# Patient Record
Sex: Female | Born: 1985 | Race: White | Hispanic: Yes | Marital: Single | Smoking: Never smoker
Health system: Southern US, Community
[De-identification: ages and names within clinical notes are randomized; demographics above are authoritative.]

## PROBLEM LIST (undated history)

## (undated) DIAGNOSIS — R87629 Unspecified abnormal cytological findings in specimens from vagina: Secondary | ICD-10-CM

## (undated) DIAGNOSIS — Z789 Other specified health status: Secondary | ICD-10-CM

## (undated) HISTORY — PX: CERVICAL CONE BIOPSY: SUR198

---

## 2016-11-04 ENCOUNTER — Ambulatory Visit: Payer: Self-pay | Attending: Family Medicine | Admitting: Family Medicine

## 2016-11-04 ENCOUNTER — Encounter: Payer: Self-pay | Admitting: Family Medicine

## 2016-11-04 VITALS — BP 109/71 | HR 73 | Temp 98.7°F | Ht 64.0 in | Wt 144.8 lb

## 2016-11-04 DIAGNOSIS — R1032 Left lower quadrant pain: Secondary | ICD-10-CM | POA: Insufficient documentation

## 2016-11-04 DIAGNOSIS — M5442 Lumbago with sciatica, left side: Secondary | ICD-10-CM | POA: Insufficient documentation

## 2016-11-04 DIAGNOSIS — N644 Mastodynia: Secondary | ICD-10-CM | POA: Insufficient documentation

## 2016-11-04 DIAGNOSIS — Z79899 Other long term (current) drug therapy: Secondary | ICD-10-CM | POA: Insufficient documentation

## 2016-11-04 LAB — POCT URINALYSIS DIPSTICK
Bilirubin, UA: NEGATIVE
Glucose, UA: NEGATIVE
KETONES UA: NEGATIVE
Leukocytes, UA: NEGATIVE
Nitrite, UA: NEGATIVE
PH UA: 5.5
PROTEIN UA: NEGATIVE
Urobilinogen, UA: 0.2

## 2016-11-04 LAB — POCT URINE PREGNANCY: Preg Test, Ur: NEGATIVE

## 2016-11-04 MED ORDER — CYCLOBENZAPRINE HCL 10 MG PO TABS: 10.0000 mg | ORAL_TABLET | Freq: Two times a day (BID) | ORAL | 1 refills | Status: AC | PRN

## 2016-11-04 MED ORDER — POLYETHYLENE GLYCOL 3350 17 GM/SCOOP PO POWD: 17.0000 g | Freq: Every day | ORAL | 1 refills | Status: DC

## 2016-11-04 MED FILL — POLYETHYLENE GLYCOL 3350: 30 days supply | Qty: 510 | Fill #0

## 2016-11-04 MED FILL — ?CYCLOBENZAPRINE 10 MG TABL: 10 | 30 days supply | Qty: 60 | Fill #0

## 2016-11-04 NOTE — Progress Notes (Signed)
Subjective:  Patient ID: Maria RompNancy Azucena Gallagher, female    DOB: [birth year]  Age: [age] y.o. MRN: [id number]  CC: Abdominal Pain (stabbing pain- one month) and Mass (left sided present for several weeks- comes and goes)   HPI Maria Gallagher is a 30 year old female who comes into the clinic to establish care. She complains of a 3 month history of intermittent left lower quadrant pain. Denies urinary symptoms, vaginal discharge or fever. Endorses a 1 week history of bilateral breast pain but denies palpation of any masses LMP was 10/13/16 She is sexually active with the same partner. Bowel movements occur daily.  Also complaining of low back pain for the last 15 days which radiates to her left hip and sometimes prevents her from walking. Pain at its worst gets to a 10/10 right now is very minimal. She denies numbness in extremities or radiation down her legs.   History reviewed. No pertinent past medical history.   No outpatient prescriptions prior to visit.   No facility-administered medications prior to visit.     ROS Review of Systems  Constitutional: Negative for activity change and appetite change.  HENT: Negative for sinus pressure and sore throat.   Respiratory: Negative for chest tightness, shortness of breath and wheezing.   Cardiovascular: Negative for chest pain and palpitations.  Gastrointestinal: Positive for abdominal pain. Negative for abdominal distention and constipation.  Genitourinary: Negative.   Musculoskeletal: Positive for back pain.  Psychiatric/Behavioral: Negative for behavioral problems and dysphoric mood.    Objective:  BP 109/71 (BP Location: Right Arm, Patient Position: Sitting, Cuff Size: Small)   Pulse 73   Temp 98.7 F (37.1 C) (Oral)   Ht 5\' 4"  (1.626 m)   Wt 144 lb 12.8 oz (65.7 kg)   SpO2 100%   BMI 24.85 kg/m   BP/Weight 11/04/2016  Systolic BP 109  Diastolic BP 71  Wt. (Lbs) 144.8  BMI 24.85      Physical Exam    Constitutional: She is oriented to person, place, and time. She appears well-developed and well-nourished.  Cardiovascular: Normal rate, normal heart sounds and intact distal pulses.   No murmur heard. Pulmonary/Chest: Effort normal and breath sounds normal. She has no wheezes. She has no rales. She exhibits no tenderness. Right breast exhibits no mass and no tenderness. Left breast exhibits no mass and no tenderness.  Abdominal: Soft. Bowel sounds are normal. She exhibits no distension and no mass. There is tenderness (mild left lower quadrant tendernes).  Musculoskeletal: Normal range of motion. She exhibits no tenderness (No tenderness in the lumbar region; negative straight leg raise bilaterally).  Neurological: She is alert and oriented to person, place, and time.     Assessment & Plan:   1. Left lower quadrant pain UA negative for UTI We'll send off urine cytology to screen for STDs Placed on MiraLAX in the event that constipation could be contributing to her symptoms Pelvic ultrasound will be ordered at next visit if symptoms persist - ibuprofen (ADVIL,MOTRIN) 200 MG tablet; Take 200 mg by mouth every 6 (six) hours as needed. - vitamin C (ASCORBIC ACID) 250 MG tablet; Take 250 mg by mouth daily. - Urine cytology ancillary only - Urinalysis Dipstick  2. Acute midline low back pain with left-sided sciatica Placed on Flexeril - ibuprofen (ADVIL,MOTRIN) 200 MG tablet; Take 200 mg by mouth every 6 (six) hours as needed. - vitamin C (ASCORBIC ACID) 250 MG tablet; Take 250 mg by mouth daily. - Urine cytology ancillary only -  Urinalysis Dipstick  3. Mastodynia, female Unknown etiology Breast exam is negative If symptoms persist she will need a breast ultrasound - ibuprofen (ADVIL,MOTRIN) 200 MG tablet; Take 200 mg by mouth every 6 (six) hours as needed. - vitamin C (ASCORBIC ACID) 250 MG tablet; Take 250 mg by mouth daily. - Urine cytology ancillary only - Urinalysis Dipstick -  POCT urine pregnancy   Meds ordered this encounter  Medications  . cyclobenzaprine (FLEXERIL) 10 MG tablet    Sig: Take 1 tablet (10 mg total) by mouth 2 (two) times daily as needed for muscle spasms.    Dispense:  60 tablet    Refill:  1  . polyethylene glycol powder (GLYCOLAX/MIRALAX) powder    Sig: Take 17 g by mouth daily.    Dispense:  3350 g    Refill:  1    Follow-up: Return in about 1 month (around 12/04/2016) for folow up of back and abdominal pain.   Jaclyn ShaggyEnobong Amao MD

## 2016-11-04 NOTE — Progress Notes (Signed)
Hx of HPV- cervix cauterized

## 2016-11-05 LAB — URINE CYTOLOGY ANCILLARY ONLY
Chlamydia: NEGATIVE
Neisseria Gonorrhea: NEGATIVE
TRICH (WINDOWPATH): NEGATIVE

## 2016-11-07 LAB — URINE CYTOLOGY ANCILLARY ONLY
BACTERIAL VAGINITIS: NEGATIVE
CANDIDA VAGINITIS: NEGATIVE

## 2016-11-18 ENCOUNTER — Telehealth: Payer: Self-pay

## 2016-11-18 NOTE — Telephone Encounter (Signed)
-----   Message from Jaclyn ShaggyEnobong Amao, MD sent at 11/06/2016 10:40 AM EST ----- Test came back negative for chlamydia, gonorrhea and Trichomonas

## 2016-11-18 NOTE — Telephone Encounter (Signed)
Writer called patient through [employer] and was unable to speak with her.

## 2016-11-25 ENCOUNTER — Ambulatory Visit: Payer: Self-pay | Attending: Internal Medicine

## 2016-11-26 ENCOUNTER — Telehealth: Payer: Self-pay

## 2016-11-26 NOTE — Telephone Encounter (Signed)
Writer called patient through [employer] per Dr. Venetia NightAmao and was able to discuss lab test results with patient.  Patient stated understanding.

## 2017-02-19 ENCOUNTER — Encounter: Payer: Self-pay | Admitting: Family Medicine

## 2017-02-19 ENCOUNTER — Ambulatory Visit: Payer: Self-pay | Attending: Family Medicine | Admitting: Family Medicine

## 2017-02-19 VITALS — BP 104/67 | HR 70 | Temp 98.2°F | Ht 65.0 in | Wt 140.4 lb

## 2017-02-19 DIAGNOSIS — Z79899 Other long term (current) drug therapy: Secondary | ICD-10-CM | POA: Insufficient documentation

## 2017-02-19 DIAGNOSIS — R519 Headache, unspecified: Secondary | ICD-10-CM

## 2017-02-19 DIAGNOSIS — R102 Pelvic and perineal pain: Secondary | ICD-10-CM | POA: Insufficient documentation

## 2017-02-19 DIAGNOSIS — R51 Headache: Secondary | ICD-10-CM | POA: Insufficient documentation

## 2017-02-19 MED ORDER — BUTALBITAL-APAP-CAFFEINE 50-325-40 MG PO TABS: 1.0000 | ORAL_TABLET | Freq: Three times a day (TID) | ORAL | 0 refills | Status: AC | PRN

## 2017-02-19 MED FILL — BUTALB-ACETAMIN-CAFF 50-325: 50-325-40 | 10 days supply | Qty: 30 | Fill #0

## 2017-02-19 NOTE — Progress Notes (Signed)
Subjective:  Patient ID: Maria Gallagher, female    DOB: 1986/07/01  Age: 31 y.o. MRN: [id number]  CC: Headache (severe since sunday); Nausea; and Blurred Vision   HPI Maria Rompancy Azucena Azad presents Complaining of a five-day history of headaches in her bilateral temples which feels like her head is about to explode. This is the first occurrence of such and she denies a history of migraines. She does have some nausea and blurred vision with the headaches but no photophobia or neck pain. Denies sinus tenderness, postnasal drip or fevers.  She  had complained of breast pain and low back pain at her last visit which have all resolved.  She continues to have right lower quadrant and left lower quadrant pain; LMP was one and a half weeks ago.  History reviewed. No pertinent past medical history.  History reviewed. No pertinent surgical history.  Not on File   Outpatient Medications Prior to Visit  Medication Sig Dispense Refill  . cyclobenzaprine (FLEXERIL) 10 MG tablet Take 1 tablet (10 mg total) by mouth 2 (two) times daily as needed for muscle spasms. 60 tablet 1  . ibuprofen (ADVIL,MOTRIN) 200 MG tablet Take 200 mg by mouth every 6 (six) hours as needed.    . vitamin C (ASCORBIC ACID) 250 MG tablet Take 250 mg by mouth daily.    . polyethylene glycol powder (GLYCOLAX/MIRALAX) powder Take 17 g by mouth daily. (Patient not taking: Reported on 02/19/2017) 3350 g 1   No facility-administered medications prior to visit.     ROS Review of Systems  Constitutional: Negative for activity change and appetite change.  HENT: Negative for sinus pressure and sore throat.   Respiratory: Negative for chest tightness, shortness of breath and wheezing.   Cardiovascular: Negative for chest pain and palpitations.  Gastrointestinal: Positive for abdominal distention (RLQ and LLQ). Negative for abdominal pain and constipation.  Genitourinary: Negative.   Musculoskeletal: Negative.   Neurological:  Positive for headaches.  Psychiatric/Behavioral: Negative for behavioral problems and dysphoric mood.    Objective:  BP 104/67 (BP Location: Right Arm, Patient Position: Sitting, Cuff Size: Small)   Pulse 70   Temp 98.2 F (36.8 C) (Oral)   Ht 5\' 5"  (1.651 m)   Wt 140 lb 6.4 oz (63.7 kg)   SpO2 100%   BMI 23.36 kg/m   BP/Weight 02/19/2017 11/04/2016  Systolic BP 104 109  Diastolic BP 67 71  Wt. (Lbs) 140.4 144.8  BMI 23.36 24.85      Physical Exam  Constitutional: She is oriented to person, place, and time. She appears well-developed and well-nourished.  Cardiovascular: Normal rate, normal heart sounds and intact distal pulses.   No murmur heard. Pulmonary/Chest: Effort normal and breath sounds normal. She has no wheezes. She has no rales. She exhibits no tenderness.  Abdominal: Soft. Bowel sounds are normal. She exhibits no distension and no mass. There is no tenderness.  Musculoskeletal: Normal range of motion.  Neurological: She is alert and oriented to person, place, and time.     Assessment & Plan:   1. Sinus headache We'll reassess at next visit for improvement - butalbital-acetaminophen-caffeine (FIORICET, ESGIC) 50-325-40 MG tablet; Take 1 tablet by mouth every 8 (eight) hours as needed for headache.  Dispense: 30 tablet; Refill: 0  2. Pelvic pain Will need to exclude ovarian cyst - US Pelvis Complete; Future - US Transvaginal Non-OB; Future   Meds ordered this encounter  Medications  . butalbital-acetaminophen-caffeine (FIORICET, ESGIC) 50-325-40 MG tablet  Sig: Take 1 tablet by mouth every 8 (eight) hours as needed for headache.    Dispense:  30 tablet    Refill:  0    Follow-up: Return in about 1 month (around 03/19/2017) for Follow-up on pelvic pain.   Jaclyn Shaggy MD

## 2017-02-26 ENCOUNTER — Ambulatory Visit (HOSPITAL_COMMUNITY)
Admission: RE | Admit: 2017-02-26 | Discharge: 2017-02-26 | Disposition: A | Discharge status: Home / Self Care | Payer: Self-pay | Source: Ambulatory Visit | Attending: Family Medicine | Admitting: Family Medicine

## 2017-02-26 DIAGNOSIS — R102 Pelvic and perineal pain: Secondary | ICD-10-CM | POA: Insufficient documentation

## 2017-03-09 ENCOUNTER — Telehealth: Payer: Self-pay | Admitting: Family Medicine

## 2017-03-09 ENCOUNTER — Telehealth: Payer: Self-pay

## 2017-03-09 NOTE — Telephone Encounter (Signed)
Through [employer] patient was called and a VM was left letting her know that writer was trying to reach her to discuss lab results.

## 2017-03-09 NOTE — Telephone Encounter (Signed)
  Patient returning call regarding results 

## 2017-03-09 NOTE — Telephone Encounter (Signed)
-----   Message from Jaclyn ShaggyEnobong Amao, MD sent at 03/01/2017  1:20 PM EST ----- She has multiple cysts in her ovaries, these cysts are usually observed and no surgical intervention is recommended.

## 2017-03-19 ENCOUNTER — Encounter: Payer: Self-pay | Admitting: Family Medicine

## 2017-03-19 ENCOUNTER — Ambulatory Visit: Payer: Self-pay | Admitting: Family Medicine

## 2017-03-19 ENCOUNTER — Ambulatory Visit: Payer: Self-pay | Attending: Family Medicine | Admitting: Family Medicine

## 2017-03-19 VITALS — BP 120/77 | HR 61 | Temp 98.6°F | Resp 16 | Wt 143.4 lb

## 2017-03-19 DIAGNOSIS — Z13228 Encounter for screening for other metabolic disorders: Secondary | ICD-10-CM | POA: Insufficient documentation

## 2017-03-19 DIAGNOSIS — R51 Headache: Secondary | ICD-10-CM | POA: Insufficient documentation

## 2017-03-19 DIAGNOSIS — Z79899 Other long term (current) drug therapy: Secondary | ICD-10-CM | POA: Insufficient documentation

## 2017-03-19 DIAGNOSIS — N979 Female infertility, unspecified: Secondary | ICD-10-CM

## 2017-03-19 DIAGNOSIS — E282 Polycystic ovarian syndrome: Secondary | ICD-10-CM | POA: Insufficient documentation

## 2017-03-19 DIAGNOSIS — R519 Headache, unspecified: Secondary | ICD-10-CM

## 2017-03-19 DIAGNOSIS — G4709 Other insomnia: Secondary | ICD-10-CM | POA: Insufficient documentation

## 2017-03-19 LAB — COMPREHENSIVE METABOLIC PANEL
ALT: 11 U/L (ref 6–29)
AST: 15 U/L (ref 10–30)
Albumin: 4.3 g/dL (ref 3.6–5.1)
Alkaline Phosphatase: 68 U/L (ref 33–115)
BUN: 11 mg/dL (ref 7–25)
CHLORIDE: 103 mmol/L (ref 98–110)
CO2: 24 mmol/L (ref 20–31)
CREATININE: 0.69 mg/dL (ref 0.50–1.10)
Calcium: 9.3 mg/dL (ref 8.6–10.2)
Glucose, Bld: 81 mg/dL (ref 65–99)
Potassium: 4.4 mmol/L (ref 3.5–5.3)
SODIUM: 139 mmol/L (ref 135–146)
Total Bilirubin: 0.5 mg/dL (ref 0.2–1.2)
Total Protein: 7.2 g/dL (ref 6.1–8.1)

## 2017-03-19 LAB — LIPID PANEL W/REFLEX DIRECT LDL
CHOL/HDL RATIO: 2 ratio (ref <5.0)
Cholesterol: 173 mg/dL (ref <200)
HDL: 88 mg/dL (ref >50)
LDL-Cholesterol: 71 mg/dL
NON-HDL CHOLESTEROL (CALC): 85 mg/dL (ref <130)
Triglycerides: 67 mg/dL (ref <150)

## 2017-03-19 LAB — TSH: TSH: 1.75 m[IU]/L

## 2017-03-19 MED ORDER — TRAZODONE HCL 50 MG PO TABS: 50.0000 mg | ORAL_TABLET | Freq: Every evening | ORAL | 2 refills | Status: AC | PRN

## 2017-03-19 MED FILL — traZODone HCL 50 MG TABS: 50 | 30 days supply | Qty: 30 | Fill #0

## 2017-03-19 NOTE — Progress Notes (Signed)
Pt is in the office today for a f/u Pt states she would like the results from her ultrasound Pt states she was prescribed Butalbital for headaches and it is not working

## 2017-03-19 NOTE — Progress Notes (Signed)
Subjective:  Patient ID: Maria Gallagher, female    DOB: 12/23/1986  Age: 31 y.o. MRN: [id number]  CC: Follow-up   HPI Maria Gallagher is a 30 year old female who presents for follow-up visit. At her last office visit she had complained of intermittent pelvic pain and pelvic and transvaginal ultrasound revealed findings suspicious for polycystic ovarian syndrome. The patient endorses difficulty conceiving but does have one 60 year old child; would like to be referred to GYN for infertility treatment.  She had also complained of headaches and received Fioricet Her last visit which does not seem to be effective. She endorses insomnia which is daily and multiple nighttime awakenings. Denies caffeine intake and does not sleep with the TV on. Denies nausea, vomiting, blurry vision.  She is fasting in anticipation of labs today.  No past medical history on file.  No past surgical history on file.  Not on File   Outpatient Medications Prior to Visit  Medication Sig Dispense Refill  . butalbital-acetaminophen-caffeine (FIORICET, ESGIC) 50-325-40 MG tablet Take 1 tablet by mouth every 8 (eight) hours as needed for headache. 30 tablet 0  . cyclobenzaprine (FLEXERIL) 10 MG tablet Take 1 tablet (10 mg total) by mouth 2 (two) times daily as needed for muscle spasms. (Patient not taking: Reported on 03/19/2017) 60 tablet 1  . ibuprofen (ADVIL,MOTRIN) 200 MG tablet Take 200 mg by mouth every 6 (six) hours as needed.    . polyethylene glycol powder (GLYCOLAX/MIRALAX) powder Take 17 g by mouth daily. (Patient not taking: Reported on 02/19/2017) 3350 g 1  . vitamin C (ASCORBIC ACID) 250 MG tablet Take 250 mg by mouth daily.     No facility-administered medications prior to visit.     ROS Review of Systems  Constitutional: Negative for activity change, appetite change and fatigue.  HENT: Negative for congestion, sinus pressure and sore throat.   Eyes: Negative for visual disturbance.    Respiratory: Negative for cough, chest tightness, shortness of breath and wheezing.   Cardiovascular: Negative for chest pain and palpitations.  Gastrointestinal: Negative for abdominal distention, abdominal pain and constipation.  Endocrine: Negative for polydipsia.  Genitourinary: Positive for pelvic pain (ntermittent). Negative for dysuria and frequency.  Musculoskeletal: Negative for arthralgias and back pain.  Skin: Negative for rash.  Neurological: Positive for headaches. Negative for tremors, light-headedness and numbness.  Hematological: Does not bruise/bleed easily.  Psychiatric/Behavioral: Negative for agitation and behavioral problems.    Objective:  BP 120/77   Pulse 61   Temp 98.6 F (37 C) (Oral)   Resp 16   Wt 143 lb 6.4 oz (65 kg)   SpO2 98%   BMI 23.86 kg/m   BP/Weight 03/19/2017 02/19/2017 11/04/2016  Systolic BP 120 104 109  Diastolic BP 77 67 71  Wt. (Lbs) 143.4 140.4 144.8  BMI 23.86 23.36 24.85      Physical Exam  Constitutional: She is oriented to person, place, and time. She appears well-developed and well-nourished.  HENT:  Right Ear: External ear normal.  Left Ear: External ear normal.  Mouth/Throat: Oropharynx is clear and moist.  Cardiovascular: Normal rate, normal heart sounds and intact distal pulses.   No murmur heard. Pulmonary/Chest: Effort normal and breath sounds normal. She has no wheezes. She has no rales. She exhibits no tenderness.  Abdominal: Soft. Bowel sounds are normal. She exhibits no distension and no mass. There is no tenderness.  Musculoskeletal: Normal range of motion.  Neurological: She is alert and oriented to person, place, and time.  Skin: Skin is warm and dry.  Psychiatric: She has a normal mood and affect.    CLINICAL DATA:  31 year old female with recurrent bilateral pelvic pain. Initial encounter. LMP 02/12/2017.  EXAM: TRANSABDOMINAL AND TRANSVAGINAL ULTRASOUND OF PELVIS  TECHNIQUE: Both transabdominal  and transvaginal ultrasound examinations of the pelvis were performed. Transabdominal technique was performed for global imaging of the pelvis including uterus, ovaries, adnexal regions, and pelvic cul-de-sac. It was necessary to proceed with endovaginal exam following the transabdominal exam to visualize the ovaries.  COMPARISON:  None  FINDINGS: Uterus  Measurements: 7.9 x 3.8 x 4.5 cm. No fibroids or other mass visualized.  Endometrium  Thickness: 11 mm.  No focal abnormality visualized.  Right ovary  Measurements: 4.0 x 2.4 x 2.7 cm. Multiple peripherally located small follicles. No ovarian mass.  Left ovary  Measurements: 4.1 x 2.3 x 2.6 cm. Multiple peripherally located small follicles. Small 14 mm probable corpus luteum (images 59-61). No ovarian mass.  Other findings  Small volume of pelvic free fluid.  IMPRESSION: Overall physiologic appearance of the pelvis, although the appearance of somewhat numerous peripherally located follicles in both ovaries raises the possibility of polycystic ovarian syndrome.   Electronically Signed   By: Odessa FlemingH  Hall M.D.   On: 02/26/2017 15:05   Assessment & Plan:   1. Other insomnia Discussed sleep hygiene - traZODone (DESYREL) 50 MG tablet; Take 1 tablet (50 mg total) by mouth at bedtime as needed for sleep.  Dispense: 30 tablet; Refill: 2  2. 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 PCOS (polycystic ovarian syndrome) - Ambulatory referral to Gynecology  5. Screening for metabolic disorder - Comprehensive metabolic panel - TSH - Lipid Panel w/reflex Direct LDL   Meds ordered this encounter  Medications  . traZODone (DESYREL) 50 MG tablet    Sig: Take 1 tablet (50 mg total) by mouth at bedtime as needed for sleep.    Dispense:  30 tablet     Refill:  2    Follow-up: Return in about 3 months (around 06/19/2017) for follow up on headaches and insomnia.   Jaclyn ShaggyEnobong Amao MD

## 2017-03-19 NOTE — Patient Instructions (Signed)
Polycystic Ovarian Syndrome Polycystic ovarian syndrome (PCOS) is a common hormonal disorder among women of reproductive age. In most women with PCOS, many small fluid-filled sacs (cysts) grow on the ovaries, and the cysts are not part of a normal menstrual cycle. PCOS can cause problems with your menstrual periods and make it difficult to get pregnant. It can also cause an increased risk of miscarriage with pregnancy. If it is not treated, PCOS can lead to serious health problems, such as diabetes and heart disease. What are the causes? The cause of PCOS is not known, but it may be the result of a combination of certain factors, such as:  Irregular menstrual cycle.  High levels of certain hormones (androgens).  Problems with the hormone that helps to control blood sugar (insulin resistance).  Certain genes. What increases the risk? This condition is more likely to develop in women who have a family history of PCOS. What are the signs or symptoms? Symptoms of PCOS may include:  Multiple ovarian cysts.  Infrequent periods or no periods.  Periods that are too frequent or too heavy.  Unpredictable periods.  Inability to get pregnant (infertility) because of not ovulating.  Increased growth of hair on the face, chest, stomach, back, thumbs, thighs, or toes.  Acne or oily skin. Acne may develop during adulthood, and it may not respond to treatment.  Pelvic pain.  Weight gain or obesity.  Patches of thickened and dark brown or black skin on the neck, arms, breasts, or thighs (acanthosis nigricans).  Excess hair growth on the face, chest, abdomen, or upper thighs (hirsutism). How is this diagnosed? This condition is diagnosed based on:  Your medical history.  A physical exam, including a pelvic exam. Your health care provider may look for areas of increased hair growth on your skin.  Tests, such as:  Ultrasound. This may be used to examine the ovaries and the lining of the  uterus (endometrium) for cysts.  Blood tests. These may be used to check levels of sugar (glucose), female hormone (testosterone), and female hormones (estrogen and progesterone) in your blood. How is this treated? There is no cure for PCOS, but treatment can help to manage symptoms and prevent more health problems from developing. Treatment varies depending on:  Your symptoms.  Whether you want to have a baby or whether you need birth control (contraception). Treatment may include nutrition and lifestyle changes along with:  Progesterone hormone to start a menstrual period.  Birth control pills to help you have regular menstrual periods.  Medicines to make you ovulate, if you want to get pregnant.  Medicine to reduce excessive hair growth.  Surgery, in severe cases. This may involve making small holes in one or both of your ovaries. This decreases the amount of testosterone that your body produces. Follow these instructions at home:  Take over-the-counter and prescription medicines only as told by your health care provider.  Follow a healthy meal plan. This can help you reduce the effects of PCOS.  Eat a healthy diet that includes lean proteins, complex carbohydrates, fresh fruits and vegetables, low-fat dairy products, and healthy fats. Make sure to eat enough fiber.  If you are overweight, lose weight as told by your health care provider.  Losing 10% of your body weight may improve symptoms.  Your health care provider can determine how much weight loss is best for you and can help you lose weight safely.  Keep all follow-up visits as told by your health care provider. This  is important. Contact a health care provider if:  Your symptoms do not get better with medicine.  You develop new symptoms. This information is not intended to replace advice given to you by your health care provider. Make sure you discuss any questions you have with your health care provider. Document  Released: 04/09/2005 Document Revised: 08/11/2016 Document Reviewed: 05/31/2016 Elsevier Interactive Patient Education  2017 Elsevier Inc.  

## 2017-03-30 ENCOUNTER — Telehealth: Payer: Self-pay

## 2017-03-30 NOTE — Telephone Encounter (Signed)
Writer called patient through [employer] and was able to discuss lab results as well as her [place] result.  Patient stated understanding. Patient also had a big concern because she has a $400. Lab bill.  Writer spoke with Diane B. Artist.  She was going to have Daisey contact the patient and set up an appt to film out a [employer] lab financial help application.

## 2017-03-30 NOTE — Telephone Encounter (Signed)
-----   Message from Jaclyn Shaggy, MD sent at 03/01/2017  1:20 PM EST ----- She has multiple cysts in her ovaries, these cysts are usually observed and no surgical intervention is recommended.

## 2017-05-05 ENCOUNTER — Encounter: Payer: Self-pay | Admitting: Obstetrics & Gynecology

## 2017-05-05 ENCOUNTER — Ambulatory Visit (INDEPENDENT_AMBULATORY_CARE_PROVIDER_SITE_OTHER): Payer: Self-pay | Admitting: Obstetrics & Gynecology

## 2017-05-05 VITALS — BP 122/80 | HR 75 | Ht 65.0 in | Wt 141.0 lb

## 2017-05-05 DIAGNOSIS — N979 Female infertility, unspecified: Secondary | ICD-10-CM

## 2017-05-05 NOTE — Progress Notes (Signed)
   Subjective:    Patient ID: Maria Gallagher, female    DOB: Nov 20, 1986, 31 y.o.   MRN: [id number]  HPI 31 yo S H P 53(33 yo daughter) here with the issue of PCOS. She has been trying for 4 years to conceive without success. She had an u/s that was suspicous for PCOS. She conceived her daughter naturally, took about 2 years. This is a different man. He has no kids. Periods are monthly lasting 3 days.    Review of Systems Pap [place] 18 [place] ago and it was reportedly normal. 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 April MansonYalcinkaya.

## 2017-12-28 NOTE — L&D Delivery Note (Addendum)
Delivery Note I was on unit when patient called out and said baby was coming.  Faculty practice doctors tied up so we proceeded with delivery.  Pt pushed well and at 10:10 AM a healthy female was delivered via Vaginal, Spontaneous (Presentation: OA  ).  APGAR: 9, 9; weight pending .   Placenta status: delivered spontaneously.  Cord:  with the following complications: corporal x 1 delivered through.  Anesthesia:  none Episiotomy: none  Lacerations:  none Suture Repair: n/a Est. Blood Loss (mL):  125ml  GBS positive but received PCN x 2 doses.  Extra digit hanging from small stalk on left hand.  Pt had one as well.   Mom to postpartum.  Baby to Couplet care / Skin to Skin.  Oliver PilaKathy W Arien Benincasa 08/20/2018, 10:27 AM

## 2018-02-16 ENCOUNTER — Other Ambulatory Visit (HOSPITAL_COMMUNITY): Payer: Self-pay | Admitting: Nurse Practitioner

## 2018-02-16 DIAGNOSIS — Z3A13 13 weeks gestation of pregnancy: Secondary | ICD-10-CM

## 2018-02-16 DIAGNOSIS — Z3682 Encounter for antenatal screening for nuchal translucency: Secondary | ICD-10-CM

## 2018-02-21 LAB — OB RESULTS CONSOLE RPR: RPR: NONREACTIVE

## 2018-02-21 LAB — OB RESULTS CONSOLE HEPATITIS B SURFACE ANTIGEN: Hepatitis B Surface Ag: NEGATIVE

## 2018-02-21 LAB — OB RESULTS CONSOLE RUBELLA ANTIBODY, IGM: Rubella: IMMUNE

## 2018-02-21 LAB — OB RESULTS CONSOLE HIV ANTIBODY (ROUTINE TESTING): HIV: NONREACTIVE

## 2018-02-21 LAB — OB RESULTS CONSOLE GC/CHLAMYDIA
CHLAMYDIA, DNA PROBE: NEGATIVE
GC PROBE AMP, GENITAL: NEGATIVE

## 2018-02-21 LAB — OB RESULTS CONSOLE ABO/RH: RH Type: POSITIVE

## 2018-02-22 ENCOUNTER — Ambulatory Visit (HOSPITAL_COMMUNITY)
Admission: RE | Admit: 2018-02-22 | Discharge: 2018-02-22 | Disposition: A | Discharge status: Home / Self Care | Payer: Medicaid Other | Source: Ambulatory Visit | Attending: Nurse Practitioner | Admitting: Nurse Practitioner

## 2018-02-22 ENCOUNTER — Encounter (HOSPITAL_COMMUNITY): Payer: Self-pay

## 2018-02-22 DIAGNOSIS — Z3682 Encounter for antenatal screening for nuchal translucency: Secondary | ICD-10-CM | POA: Diagnosis present

## 2018-02-22 DIAGNOSIS — Z3A13 13 weeks gestation of pregnancy: Secondary | ICD-10-CM | POA: Diagnosis not present

## 2018-02-22 HISTORY — DX: Other specified health status: Z78.9

## 2018-07-25 LAB — OB RESULTS CONSOLE GBS: GBS: POSITIVE

## 2018-08-19 ENCOUNTER — Encounter (HOSPITAL_COMMUNITY): Payer: Self-pay | Admitting: *Deleted

## 2018-08-19 ENCOUNTER — Inpatient Hospital Stay (HOSPITAL_COMMUNITY)
Admission: AD | Admit: 2018-08-19 | Discharge: 2018-08-22 | DRG: 807 | Disposition: A | Discharge status: Home / Self Care | Payer: Medicaid Other | Attending: Obstetrics and Gynecology | Admitting: Obstetrics and Gynecology

## 2018-08-19 DIAGNOSIS — O99824 Streptococcus B carrier state complicating childbirth: Principal | ICD-10-CM | POA: Diagnosis present

## 2018-08-19 DIAGNOSIS — Z3A39 39 weeks gestation of pregnancy: Secondary | ICD-10-CM

## 2018-08-19 DIAGNOSIS — Z789 Other specified health status: Secondary | ICD-10-CM | POA: Diagnosis present

## 2018-08-19 DIAGNOSIS — O358XX0 Maternal care for other (suspected) fetal abnormality and damage, not applicable or unspecified: Secondary | ICD-10-CM | POA: Diagnosis present

## 2018-08-19 HISTORY — DX: Unspecified abnormal cytological findings in specimens from vagina: R87.629

## 2018-08-19 NOTE — MAU Note (Signed)
Ctxs since this am. Denies LOF or bleeding. Some mucousy d/c. 2cm last sve

## 2018-08-20 ENCOUNTER — Encounter (HOSPITAL_COMMUNITY): Payer: Self-pay

## 2018-08-20 DIAGNOSIS — O99824 Streptococcus B carrier state complicating childbirth: Secondary | ICD-10-CM | POA: Diagnosis present

## 2018-08-20 DIAGNOSIS — O358XX0 Maternal care for other (suspected) fetal abnormality and damage, not applicable or unspecified: Secondary | ICD-10-CM | POA: Diagnosis present

## 2018-08-20 DIAGNOSIS — Z789 Other specified health status: Secondary | ICD-10-CM | POA: Diagnosis present

## 2018-08-20 DIAGNOSIS — Z3483 Encounter for supervision of other normal pregnancy, third trimester: Secondary | ICD-10-CM | POA: Diagnosis present

## 2018-08-20 DIAGNOSIS — Z3A39 39 weeks gestation of pregnancy: Secondary | ICD-10-CM | POA: Diagnosis not present

## 2018-08-20 LAB — CBC
HEMATOCRIT: 33.5 % — AB (ref 36.0–46.0)
HEMOGLOBIN: 10.5 g/dL — AB (ref 12.0–15.0)
MCH: 26.3 pg (ref 26.0–34.0)
MCHC: 31.3 g/dL (ref 30.0–36.0)
MCV: 83.8 fL (ref 78.0–100.0)
Platelets: 286 10*3/uL (ref 150–400)
RBC: 4 MIL/uL (ref 3.87–5.11)
RDW: 15.4 % (ref 11.5–15.5)
WBC: 17.8 10*3/uL — AB (ref 4.0–10.5)

## 2018-08-20 LAB — RPR: RPR: NONREACTIVE

## 2018-08-20 LAB — TYPE AND SCREEN
ABO/RH(D): B POS
Antibody Screen: NEGATIVE

## 2018-08-20 LAB — ABO/RH: ABO/RH(D): B POS

## 2018-08-20 MED ORDER — FENTANYL CITRATE (PF) 100 MCG/2ML IJ SOLN
INTRAMUSCULAR | Status: AC
  Filled 2018-08-20: qty 2

## 2018-08-20 MED ORDER — DIBUCAINE 1 % RE OINT: 1.0000 "application " | TOPICAL_OINTMENT | RECTAL | Status: DC | PRN

## 2018-08-20 MED ORDER — ONDANSETRON HCL 4 MG PO TABS: 4.0000 mg | ORAL_TABLET | ORAL | Status: DC | PRN

## 2018-08-20 MED ORDER — LACTATED RINGERS IV SOLN: 500.0000 mL | Freq: Once | INTRAVENOUS | Status: DC

## 2018-08-20 MED ORDER — PHENYLEPHRINE 40 MCG/ML (10ML) SYRINGE FOR IV PUSH (FOR BLOOD PRESSURE SUPPORT)
80.0000 ug | PREFILLED_SYRINGE | INTRAVENOUS | Status: DC | PRN
  Filled 2018-08-20: qty 5

## 2018-08-20 MED ORDER — EPHEDRINE 5 MG/ML INJ
10.0000 mg | INTRAVENOUS | Status: DC | PRN
  Filled 2018-08-20: qty 2

## 2018-08-20 MED ORDER — ACETAMINOPHEN 325 MG PO TABS: 650.0000 mg | ORAL_TABLET | ORAL | Status: DC | PRN

## 2018-08-20 MED ORDER — SENNOSIDES-DOCUSATE SODIUM 8.6-50 MG PO TABS
2.0000 | ORAL_TABLET | ORAL | Status: DC
  Administered 2018-08-21 – 2018-08-22 (×2): 2 via ORAL
  Filled 2018-08-20 (×2): qty 2

## 2018-08-20 MED ORDER — OXYCODONE-ACETAMINOPHEN 5-325 MG PO TABS: 2.0000 | ORAL_TABLET | ORAL | Status: DC | PRN

## 2018-08-20 MED ORDER — SODIUM CHLORIDE 0.9 % IV SOLN
5.0000 10*6.[IU] | Freq: Once | INTRAVENOUS | Status: AC
  Administered 2018-08-20: 5 10*6.[IU] via INTRAVENOUS
  Filled 2018-08-20: qty 5

## 2018-08-20 MED ORDER — TETANUS-DIPHTH-ACELL PERTUSSIS 5-2.5-18.5 LF-MCG/0.5 IM SUSP: 0.5000 mL | Freq: Once | INTRAMUSCULAR | Status: DC

## 2018-08-20 MED ORDER — LIDOCAINE HCL (PF) 1 % IJ SOLN
30.0000 mL | INTRAMUSCULAR | Status: DC | PRN
  Filled 2018-08-20: qty 30

## 2018-08-20 MED ORDER — OXYCODONE-ACETAMINOPHEN 5-325 MG PO TABS
1.0000 | ORAL_TABLET | ORAL | Status: DC | PRN
Start: 2018-08-20 — End: 2018-08-20

## 2018-08-20 MED ORDER — WITCH HAZEL-GLYCERIN EX PADS: 1.0000 "application " | MEDICATED_PAD | CUTANEOUS | Status: DC | PRN

## 2018-08-20 MED ORDER — ZOLPIDEM TARTRATE 5 MG PO TABS: 5.0000 mg | ORAL_TABLET | Freq: Every evening | ORAL | Status: DC | PRN

## 2018-08-20 MED ORDER — COCONUT OIL OIL
1.0000 "application " | TOPICAL_OIL | Status: DC | PRN
  Administered 2018-08-22: 1 via TOPICAL
  Filled 2018-08-20: qty 120

## 2018-08-20 MED ORDER — LACTATED RINGERS IV SOLN
INTRAVENOUS | Status: DC
  Administered 2018-08-20 (×3): via INTRAVENOUS

## 2018-08-20 MED ORDER — ONDANSETRON HCL 4 MG/2ML IJ SOLN: 4.0000 mg | Freq: Four times a day (QID) | INTRAMUSCULAR | Status: DC | PRN

## 2018-08-20 MED ORDER — LACTATED RINGERS IV SOLN: 500.0000 mL | INTRAVENOUS | Status: DC | PRN

## 2018-08-20 MED ORDER — BENZOCAINE-MENTHOL 20-0.5 % EX AERO
1.0000 "application " | INHALATION_SPRAY | CUTANEOUS | Status: DC | PRN
  Administered 2018-08-20: 1 via TOPICAL
  Filled 2018-08-20: qty 56

## 2018-08-20 MED ORDER — IBUPROFEN 600 MG PO TABS
600.0000 mg | ORAL_TABLET | Freq: Four times a day (QID) | ORAL | Status: DC
  Administered 2018-08-20 – 2018-08-22 (×9): 600 mg via ORAL
  Filled 2018-08-20 (×9): qty 1

## 2018-08-20 MED ORDER — OXYTOCIN BOLUS FROM INFUSION
500.0000 mL | Freq: Once | INTRAVENOUS | Status: AC
  Administered 2018-08-20: 500 mL via INTRAVENOUS

## 2018-08-20 MED ORDER — PENICILLIN G 3 MILLION UNITS IVPB - SIMPLE MED
3.0000 10*6.[IU] | INTRAVENOUS | Status: DC
  Administered 2018-08-20: 3 10*6.[IU] via INTRAVENOUS
  Filled 2018-08-20 (×2): qty 100
  Filled 2018-08-20: qty 3

## 2018-08-20 MED ORDER — DIPHENHYDRAMINE HCL 50 MG/ML IJ SOLN: 12.5000 mg | INTRAMUSCULAR | Status: DC | PRN

## 2018-08-20 MED ORDER — FENTANYL CITRATE (PF) 100 MCG/2ML IJ SOLN
100.0000 ug | INTRAMUSCULAR | Status: DC | PRN
  Administered 2018-08-20: 100 ug via INTRAVENOUS

## 2018-08-20 MED ORDER — SOD CITRATE-CITRIC ACID 500-334 MG/5ML PO SOLN: 30.0000 mL | ORAL | Status: DC | PRN

## 2018-08-20 MED ORDER — FENTANYL 2.5 MCG/ML BUPIVACAINE 1/10 % EPIDURAL INFUSION (WH - ANES): 14.0000 mL/h | INTRAMUSCULAR | Status: DC | PRN

## 2018-08-20 MED ORDER — OXYTOCIN 40 UNITS IN LACTATED RINGERS INFUSION - SIMPLE MED
2.5000 [IU]/h | INTRAVENOUS | Status: DC
  Filled 2018-08-20: qty 1000

## 2018-08-20 MED ORDER — ONDANSETRON HCL 4 MG/2ML IJ SOLN: 4.0000 mg | INTRAMUSCULAR | Status: DC | PRN

## 2018-08-20 MED ORDER — FLEET ENEMA 7-19 GM/118ML RE ENEM: 1.0000 | ENEMA | RECTAL | Status: DC | PRN

## 2018-08-20 MED ORDER — SIMETHICONE 80 MG PO CHEW: 80.0000 mg | CHEWABLE_TABLET | ORAL | Status: DC | PRN

## 2018-08-20 MED ORDER — PRENATAL MULTIVITAMIN CH
1.0000 | ORAL_TABLET | Freq: Every day | ORAL | Status: DC
  Administered 2018-08-20 – 2018-08-22 (×3): 1 via ORAL
  Filled 2018-08-20 (×3): qty 1

## 2018-08-20 MED ORDER — DIPHENHYDRAMINE HCL 25 MG PO CAPS: 25.0000 mg | ORAL_CAPSULE | Freq: Four times a day (QID) | ORAL | Status: DC | PRN

## 2018-08-20 NOTE — Lactation Note (Signed)
This note was copied from a baby's chart. Lactation Consultation Note  Patient Name: Maria Gallagher [id number]'s Date: 08/20/2018 Reason for consult: Initial assessment;Term;Infant < 6lbs  8 hours old FT < 6 lbs female who is still being exclusively BF by her mother, she's a P2. However mom doesn't have much experience BF, she was able to BF her first child for only 2 weeks and that was 10 years ago. [facility] and already knows how to hand express. When [facility] with hand expression, no colostrum was noted though, tried both breast. Mom doesn't have a pump at home, [facility] and when [facility] pumping due to having a baby < 6 pounds mom voiced she feels more comfortable with pumping with a hand pump and that she had also requested formula but hasn't been brought to her yet. Pump instructions, cleaning and storage was reviewed as well as milk storage guidelines.  Reviewed pros and cons of formula supplementation for a FT < 6 pounds. RN brought Similac 22 and LC went over supplementation guidelines with parents. Parents needed further assistance to communicate with staff about issues like having baby's bracelet loosen up and filling baby's birth certificate ([place] in [place]; parents are Spanish speakers). LC assisted with filling up forms and also made extra copies.   Offered assistance with latch, parents agreed to wake baby up to feed since she hasn't eaten since noon. Baby was able to latch with a cradle hold and able to sustain the latch throughout the entire 30 minutes feeding. Audible swallows were noted. Asked mom to pump after feeding or after every other feeding; about 6 times/24 hours.  Encouraged mom to feed baby every 3 hours first at the breast and then with a bottle (with a  Nipple as per mom's choice). Discussed pace feeding technique and cluster feeding. Mom will feed baby any amount of EBM if she were to get any  but she also understands that pumping at this point is mainly for breast stimulation. Mom will supplement with Similac 22 calorie formula after feedings according to formula guidelines. BF brochure (SP), BF resources and feeding diary (SP) were reviewed, both parents are aware of LC services and will call PRN.  Maternal Data Formula Feeding for Exclusion: Yes Reason for exclusion: Mother's choice to formula and breast feed on admission Has patient been taught Hand Expression?: Yes Does the patient have breastfeeding experience prior to this delivery?: Yes  Feeding Feeding Type: Breast Fed Length of feed: 30 min  LATCH Score Latch: Grasps breast easily, tongue down, lips flanged, rhythmical sucking.  Audible Swallowing: A few with stimulation  Type of Nipple: Everted at rest and after stimulation  Comfort (Breast/Nipple): Soft / non-tender  Hold (Positioning): Assistance needed to correctly position infant at breast and maintain latch.  LATCH Score: 8  Interventions Interventions: Breast feeding basics reviewed;Assisted with latch;Skin to skin;Breast massage;Hand express;Breast compression;Adjust position;Hand pump;Support pillows  Lactation Tools Discussed/Used Tools: Pump Breast pump type: Manual WIC Program: Yes Pump Review: Setup, frequency, and cleaning;Milk Storage Initiated by:: MPeck Date initiated:: 08/20/18   Consult Status Consult Status: Follow-up Date: 08/21/18 Follow-up type: In-patient    Jona Erkkila Venetia ConstableS Prerna Harold 08/20/2018, 6:53 PM

## 2018-08-20 NOTE — Progress Notes (Signed)
Patient ID: Lindon RompNancy Azucena Amara, female   DOB: [birth year], 32 y.o.   MRN: [id number]  Feels like ctx have spaced out/become a little less uncomfortable  BP 108/67, P 73 FHR 120s, +accels, no decels Ctx q 5-6 mins Cx 3+/70/vtx -2, intact  IUP@term  Prodromal labor vs early labor  Will check cx in 3 hrs; if no change, will plan to d/c home to await labor  Cam HaiSHAW, Doriann Zuch CNM 08/20/2018 4:30 AM

## 2018-08-20 NOTE — Progress Notes (Signed)
Patient ID: Maria Gallagher, female   DOB: [birth year], 32 y.o.   MRN: [id number]  Pt states ctx are still strong but not closer  VSS, afeb FHR 130s, +accels, no decels, +SS Ctx q 5-6 mins Cx 4/70/vtx -2, SROM with exam for clear fluid  IUP@term  Latent labor SROM GBS pos  Expectant management Check cx in 2-3 hrs to eval for augmentation need Anticipate SVD  Cam HaiSHAW, Kaylan Yates CNM 08/20/2018 8:02 AM

## 2018-08-20 NOTE — H&P (Addendum)
Maria Gallagher is a 32 y.o. female presenting for spontaneous labor progression. Denies leaking or bldg; no H/A, N/V or visual disturbances.  OB History    Gravida  2   Para  1   Term  1   Preterm  0   AB  0   Living  1     SAB  0   TAB  0   Ectopic  0   Multiple  0   Live Births  0          Past Medical History:  Diagnosis Date  . Medical history non-contributory   . Vaginal Pap smear, abnormal    Past Surgical History:  Procedure Laterality Date  . CERVICAL CONE BIOPSY     Family History: family history is not on file. Social History:  reports that she has never smoked. She has never used smokeless tobacco. She reports that she does not drink alcohol or use drugs.     Maternal Diabetes: No Genetic Screening: Normal Maternal Ultrasounds/Referrals: Abnormal:  Findings:   Fetal Kidney Anomalies Fetal Ultrasounds or other Referrals:  Other: will need post partum f/u Maternal Substance Abuse:  No Significant Maternal Medications:  None Significant Maternal Lab Results:  Lab values include: Group B Strep positive, not varicella immune Other Comments:  None abnormal pap (ascus)  ROS History Dilation: 4 Effacement (%): 70 Station: -2 Exam by:: K.Wilson,RN Blood pressure 119/77, pulse 93, temperature 98.3 F (36.8 C), resp. rate 18, height 5\' 3"  (1.6 m), weight 82.6 kg, last menstrual period 11/20/2017.  Confirmed vertex by MAU cervical exam Exam Physical Exam  Prenatal labs: ABO, Rh:   Bpos Antibody:   neg Rubella:   immune RPR:   immune HBsAg:   neg HIV:   NR GBS:   pos  Assessment/Plan: expect SVD GBS pos, will treat with PCN Non-varicella immune, will treat post partum Abnormal fetal renal US, f/u postpartum at mother's request  No pain medication per maternal request, options discussed and available should she choose Once to floor, can discuss pitocin but patient has history of short labor with prior child and seems to be progressing  naturally here   Marthenia RollingScott Bland 08/20/2018, 12:05 AM  CNM attestation:  I have seen and examined this patient; I agree with above documentation in the resident's note.   Maria Gallagher is a 32 y.o. G2P1001 here for latent labor  PE: BP 108/67   Pulse 73   Temp 98.2 F (36.8 C) (Oral)   Resp 16   Ht 5\' 3"  (1.6 m)   Wt 82.6 kg   LMP 11/20/2017   BMI 32.24 kg/m  Gen: calm comfortable, NAD Resp: normal effort, no distress Abd: gravid  ROS, labs, PMH reviewed  [facility] Expectant management PCN for GBS ppx  Cam HaiSHAW, KIMBERLY CNM 08/20/2018, 4:31 AM

## 2018-08-21 LAB — CBC
HCT: 32.5 % — ABNORMAL LOW (ref 36.0–46.0)
HEMOGLOBIN: 10.2 g/dL — AB (ref 12.0–15.0)
MCH: 26.4 pg (ref 26.0–34.0)
MCHC: 31.4 g/dL (ref 30.0–36.0)
MCV: 84.2 fL (ref 78.0–100.0)
Platelets: 291 10*3/uL (ref 150–400)
RBC: 3.86 MIL/uL — ABNORMAL LOW (ref 3.87–5.11)
RDW: 15.7 % — AB (ref 11.5–15.5)
WBC: 16.1 10*3/uL — ABNORMAL HIGH (ref 4.0–10.5)

## 2018-08-21 NOTE — Progress Notes (Signed)
POSTPARTUM PROGRESS NOTE  Post Partum Day 1 Subjective:  Maria Gallagher is a 32 y.o. [id number] 7156w0d s/p SVD.  No acute events overnight.  Pt denies problems with ambulating, voiding or po intake.  She denies nausea or vomiting.  Pain is well controlled.  She has had flatus. She has had bowel movement.  Lochia Small.   Objective: Blood pressure 110/77, pulse 78, temperature 97.8 F (36.6 C), temperature source Oral, resp. rate 18, height 5\' 3"  (1.6 m), weight 82.6 kg, last menstrual period 11/20/2017, SpO2 100 %, unknown if currently breastfeeding.  Physical Exam:  General: alert, cooperative and no distress Lochia:normal flow Chest: no respiratory distress Heart:regular rate, distal pulses intact Abdomen: soft, nontender,  Uterine Fundus: firm, appropriately tender DVT Evaluation: No calf swelling or tenderness Extremities: minimal edema  Recent Labs    08/20/18 0049 08/21/18 0602  HGB 10.5* 10.2*  HCT 33.5* 32.5*    Assessment/Plan:  ASSESSMENT: Maria Gallagher is a 32 y.o. [id number] 2756w0d s/p SVD  Plan for discharge tomorrow   LOS: 1 day   Teryn Boerema BlandDO 08/21/2018, 8:58 AM

## 2018-08-22 MED ORDER — VARICELLA VIRUS VACCINE LIVE 1350 PFU/0.5ML IJ SUSR: 0.5000 mL | Freq: Once | INTRAMUSCULAR | Status: DC

## 2018-08-22 NOTE — Lactation Note (Signed)
This note was copied from a baby's chart. Lactation Consultation Note: Mothers primary language is Spanish. Mother reports that she doesn't need an interpreter, she states she understands well.   Infant is 4147 hours old. She is at -3% weight loss this am.  Mother reports that infant is breastfeeding for 20 mins and then she is supplementing with 22 Cal formula. Infant was just breastfed and supplemented.   Assist mother with hand expressing. Mothers breast are filling. She was able to hand express large drops of milk. Assist mother with using harmony hand pump. Observed good flow.  Advised mother to post pump with hand pump for 15 mins on each breast and offer infant ebm/formula after each feeding.   Discussed feeding infant at least 8-12 times in 24 hours and with all feeding cues.  Discussed cluster feeding. Reviewed milk storage guidelines.  Advised mother in treatment and prevention of engorgement.  Mother receptive to all teaching.   [facility]. She was advised to follow up and schedule next appt.   Mother informed of all [facility] and community support.    Patient Name: Maria Eliott Nineancy Caffey RUEAV'WToday's Date: 08/22/2018 Reason for consult: Follow-up assessment   Maternal Data Has patient been taught Hand Expression?: Yes(obs large drops of milk , mothers breast filling)  Feeding Feeding Type: Formula Length of feed: 20 min(per mother)  LATCH Score                   Interventions Interventions: Breast massage;Hand express;Position options;Expressed milk;Hand pump;Ice  Lactation Tools Discussed/Used     Consult Status Consult Status: Complete    [name], Maria Gallagher 08/22/2018, 9:22 AM

## 2018-08-22 NOTE — Progress Notes (Signed)
Patient has declined an Equ[employer] tradernterpreter. Maria Gallagher Interpreter.

## 2018-08-22 NOTE — Discharge Summary (Signed)
OB Discharge Summary     Patient Name: Maria Gallagher DOB: 08-13-1986 MRN: [id number]  Date of admission: 08/19/2018 Delivering MD: Huel Cote   Date of discharge: 08/22/2018  Admitting diagnosis: 39 WKS, CTX Intrauterine pregnancy: [redacted]w[redacted]d     Secondary diagnosis:  Active Problems:   Admitted to labor and delivery   NSVD (normal spontaneous vaginal delivery)  Additional problems: none     Discharge diagnosis: Term Pregnancy Delivered                                                                                                Post partum procedures:none  Augmentation: AROM  Complications: None  Hospital course:  Onset of Labor With Vaginal Delivery     32 y.o. yo G2P2002 at [redacted]w[redacted]d was admitted in Active Labor on 08/19/2018. Patient had an uncomplicated labor course as follows:  Membrane Rupture Time/Date: 7:45 AM ,08/20/2018   Intrapartum Procedures: Episiotomy: None [1]                                         Lacerations:  None [1]  Patient had a delivery of a Viable infant. 08/20/2018  Information for the patient's newborn:  Tyannah, Sane [[id number]]  Delivery Method: Vaginal, Spontaneous(Filed from Delivery Summary)    Pateint had an uncomplicated postpartum course.  She is ambulating, tolerating a regular diet, passing flatus, and urinating well. Patient is discharged home in stable condition on 08/22/18.   Physical exam  Vitals:   08/21/18 0556 08/21/18 1550 08/21/18 2117 08/22/18 0545  BP: 110/77 101/68 106/69 110/80  Pulse: 78 77 68 72  Resp: 18 16 18 20   Temp: 97.8 F (36.6 C) 98.6 F (37 C) 98.4 F (36.9 C) 98.1 F (36.7 C)  TempSrc: Oral Oral Oral Oral  SpO2: 100% 100% 100%   Weight:      Height:       General: alert, cooperative and no distress Lochia: appropriate Uterine Fundus: firm Incision: N/A DVT Evaluation: No evidence of DVT seen on physical exam. No significant calf/ankle edema. Labs: Lab Results  Component Value Date    WBC 16.1 (H) 08/21/2018   HGB 10.2 (L) 08/21/2018   HCT 32.5 (L) 08/21/2018   MCV 84.2 08/21/2018   PLT 291 08/21/2018   CMP Latest Ref Rng & Units 03/19/2017  Glucose 65 - 99 mg/dL 81  BUN 7 - 25 mg/dL 11  Creatinine 8.46 - 9.62 mg/dL 9.52  Sodium 841 - 324 mmol/L 139  Potassium 3.5 - 5.3 mmol/L 4.4  Chloride 98 - 110 mmol/L 103  CO2 20 - 31 mmol/L 24  Calcium 8.6 - 10.2 mg/dL 9.3  Total Protein 6.1 - 8.1 g/dL 7.2  Total Bilirubin 0.2 - 1.2 mg/dL 0.5  Alkaline Phos 33 - 115 U/L 68  AST 10 - 30 U/L 15  ALT 6 - 29 U/L 11    Discharge instruction: per After Visit Summary and "Baby and Me Booklet".  After visit meds:  Allergies as  of 08/22/2018   No Known Allergies     Medication List    TAKE these medications   cyclobenzaprine 10 MG tablet Commonly known as:  FLEXERIL Take 1 tablet (10 mg total) by mouth 2 (two) times daily as needed for muscle spasms.   PNV PO Take by mouth.   traZODone 50 MG tablet Commonly known as:  DESYREL Take 1 tablet (50 mg total) by mouth at bedtime as needed for sleep.       Diet: routine diet  Activity: Advance as tolerated. Pelvic rest for 6 weeks.   Outpatient follow up:6 weeks Follow up Appt:No future appointments. Follow up Visit:No follow-ups on file.  Postpartum contraception: None  Newborn Data: Live born female  Birth Weight: 5 lb 13.7 oz (2656 g) APGAR: 9, 9  Newborn Delivery   Birth date/time:  08/20/2018 10:10:00 Delivery type:  Vaginal, Spontaneous     Baby Feeding: Bottle and Breast Disposition:home with mother   08/22/2018 Sharyon CableVeronica C Annaelle Kasel, CNM

## 2019-07-07 ENCOUNTER — Encounter (HOSPITAL_COMMUNITY): Payer: Self-pay | Admitting: Genetic Counselor

## 2019-07-11 ENCOUNTER — Other Ambulatory Visit (HOSPITAL_COMMUNITY): Payer: Self-pay | Admitting: Nurse Practitioner

## 2019-07-11 DIAGNOSIS — Z3682 Encounter for antenatal screening for nuchal translucency: Secondary | ICD-10-CM

## 2019-07-11 DIAGNOSIS — O09521 Supervision of elderly multigravida, first trimester: Secondary | ICD-10-CM

## 2019-07-11 DIAGNOSIS — Z3A13 13 weeks gestation of pregnancy: Secondary | ICD-10-CM

## 2019-07-12 ENCOUNTER — Ambulatory Visit (HOSPITAL_COMMUNITY): Payer: Self-pay

## 2019-07-12 ENCOUNTER — Other Ambulatory Visit: Payer: Self-pay

## 2019-07-12 ENCOUNTER — Ambulatory Visit (HOSPITAL_COMMUNITY): Payer: Self-pay | Admitting: Obstetrics and Gynecology

## 2019-07-12 ENCOUNTER — Ambulatory Visit (HOSPITAL_COMMUNITY)
Admission: RE | Admit: 2019-07-12 | Discharge: 2019-07-12 | Disposition: A | Discharge status: Home / Self Care | Payer: Medicaid Other | Source: Ambulatory Visit | Attending: Obstetrics and Gynecology | Admitting: Obstetrics and Gynecology

## 2019-07-12 ENCOUNTER — Other Ambulatory Visit (HOSPITAL_COMMUNITY): Payer: Self-pay | Admitting: Maternal & Fetal Medicine

## 2019-07-12 ENCOUNTER — Encounter (HOSPITAL_COMMUNITY): Payer: Self-pay

## 2019-07-12 ENCOUNTER — Other Ambulatory Visit (HOSPITAL_COMMUNITY): Payer: Self-pay | Admitting: Nurse Practitioner

## 2019-07-12 ENCOUNTER — Ambulatory Visit (HOSPITAL_COMMUNITY): Payer: Self-pay | Admitting: *Deleted

## 2019-07-12 ENCOUNTER — Ambulatory Visit (HOSPITAL_COMMUNITY): Payer: Self-pay | Admitting: Nurse Practitioner

## 2019-07-12 VITALS — BP 106/57 | HR 89 | Wt 160.6 lb

## 2019-07-12 DIAGNOSIS — O09521 Supervision of elderly multigravida, first trimester: Secondary | ICD-10-CM

## 2019-07-12 DIAGNOSIS — Z3682 Encounter for antenatal screening for nuchal translucency: Secondary | ICD-10-CM

## 2019-07-12 DIAGNOSIS — O352XX0 Maternal care for (suspected) hereditary disease in fetus, not applicable or unspecified: Secondary | ICD-10-CM

## 2019-07-12 DIAGNOSIS — Z1379 Encounter for other screening for genetic and chromosomal anomalies: Secondary | ICD-10-CM

## 2019-07-12 DIAGNOSIS — O09523 Supervision of elderly multigravida, third trimester: Secondary | ICD-10-CM

## 2019-07-12 DIAGNOSIS — Z3A13 13 weeks gestation of pregnancy: Secondary | ICD-10-CM | POA: Diagnosis not present

## 2019-07-12 IMAGING — US US MFM OB COMP LESS THAN 14 WEEKS
1 series · 15 of 21 positions shown · non-contrast
Comparison: none

[Series 1: us mfm ob comp less than 14 weeks · 15 of 21 slices shown]
[im 1/21]
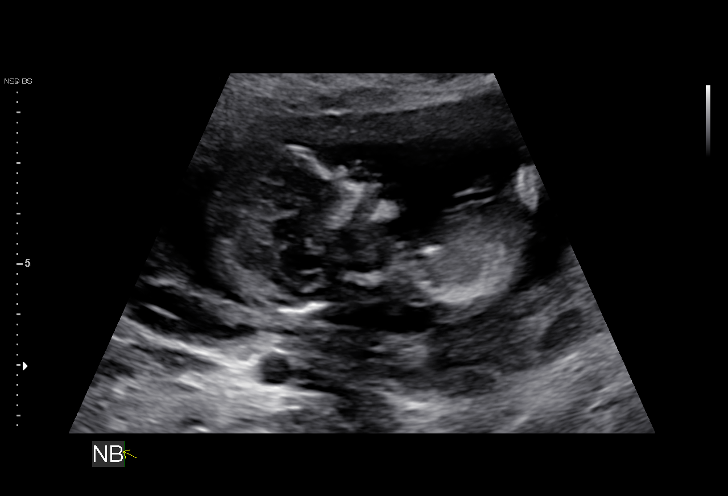
[im 3/21]
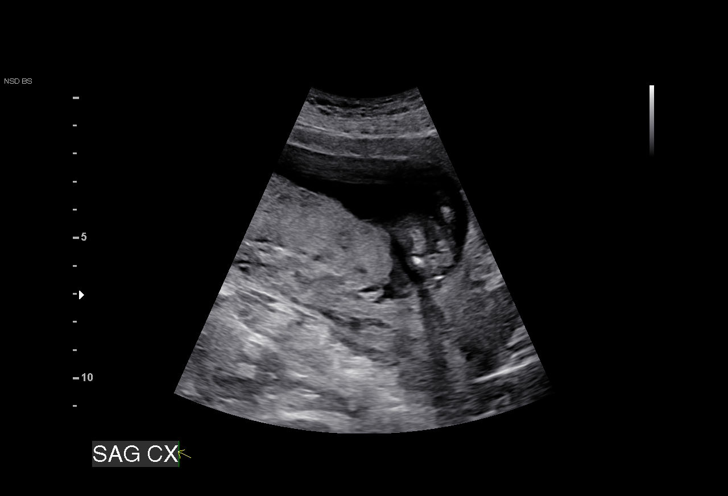
[im 4/21]
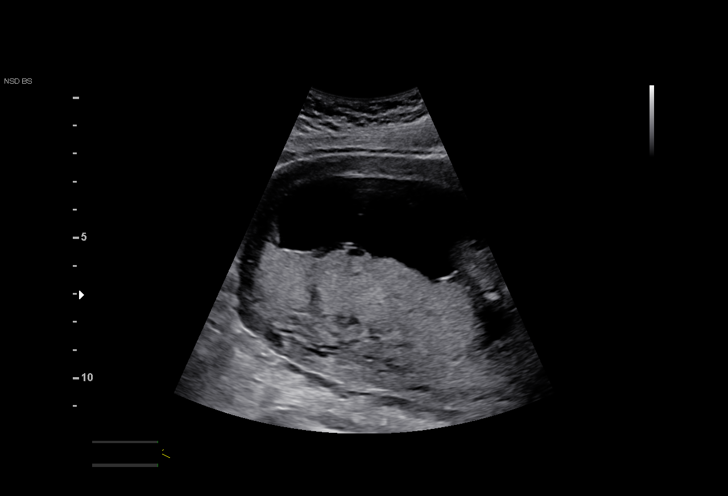
[im 5/21]
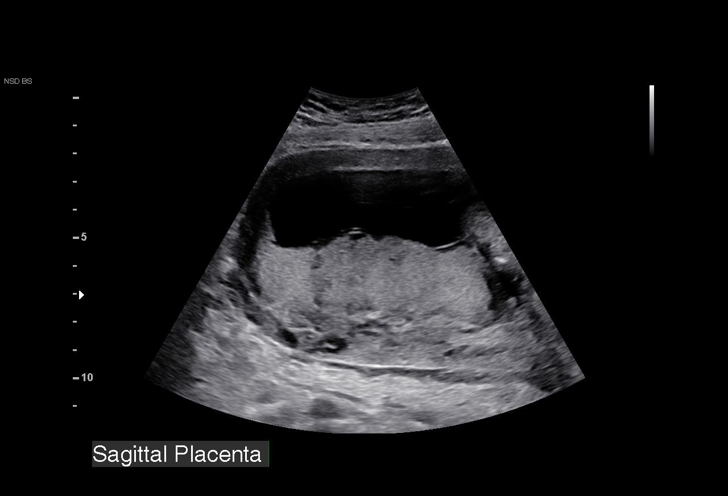
[im 7/21]
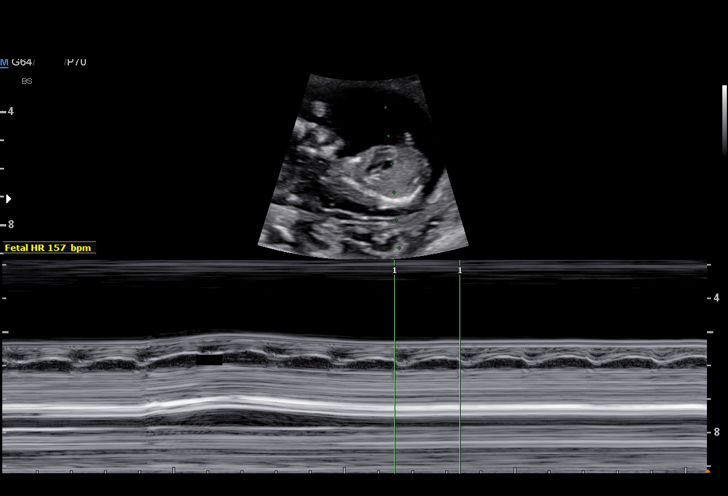
[im 8/21]
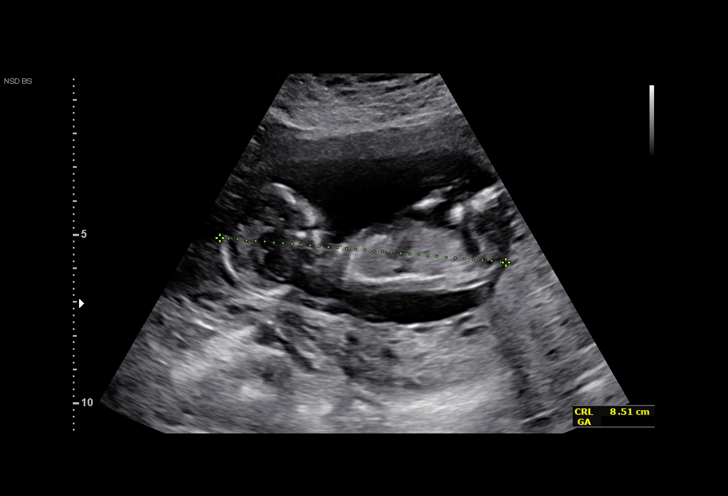
[im 10/21]
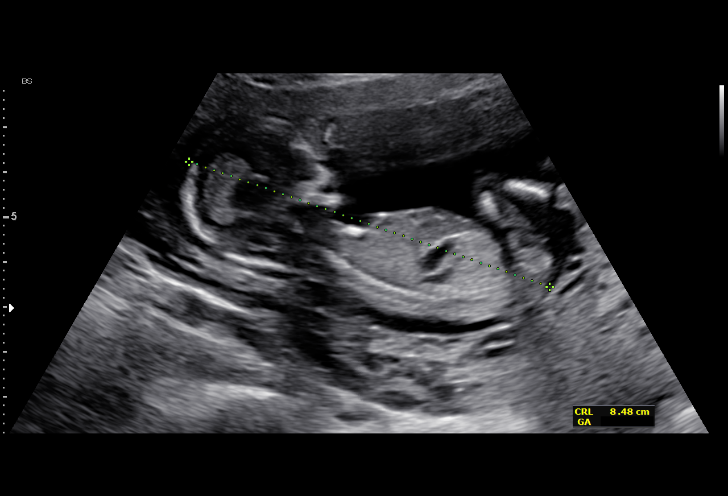
[im 11/21]
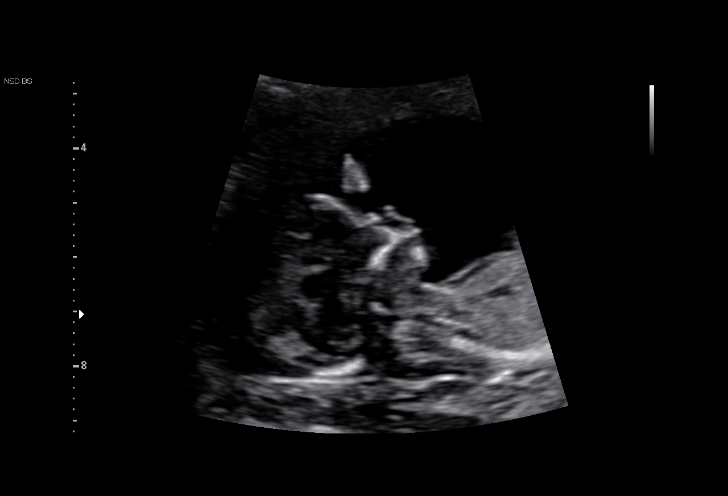
[im 12/21]
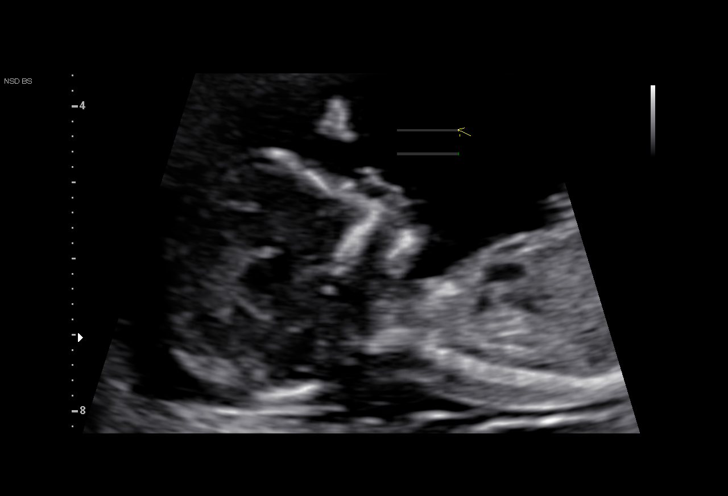
[im 14/21]
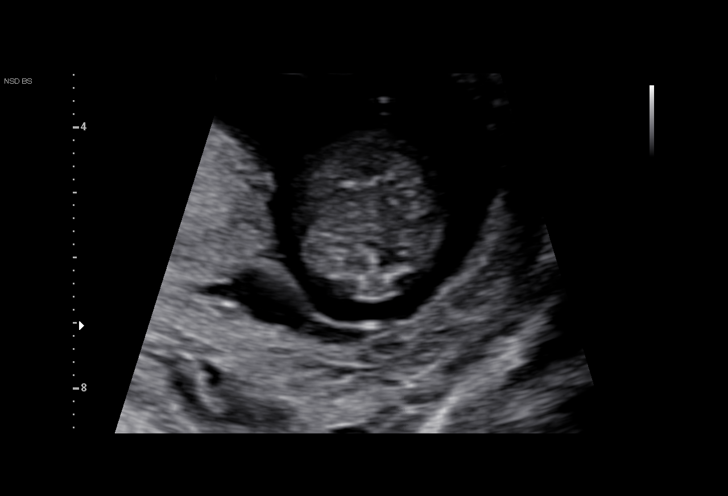
[im 15/21]
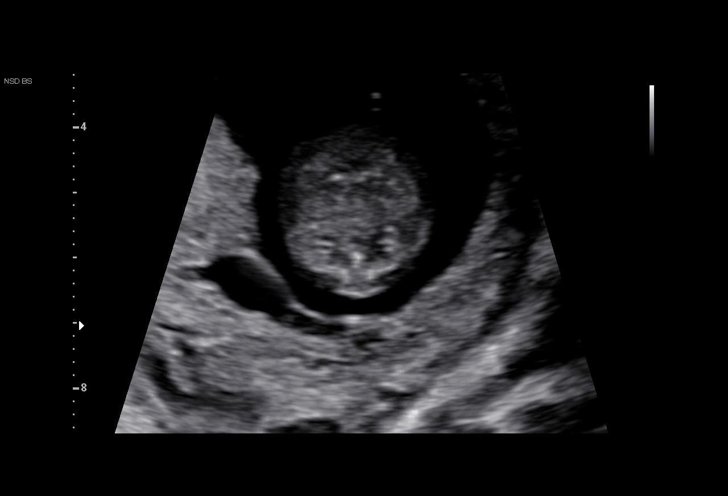
[im 17/21]
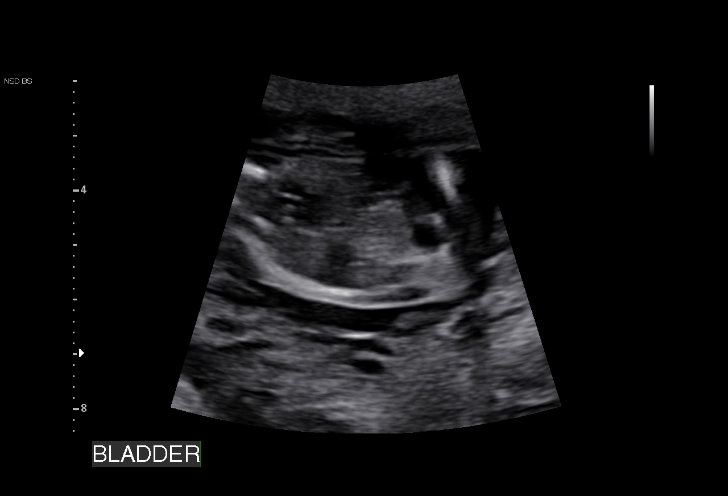
[im 18/21]
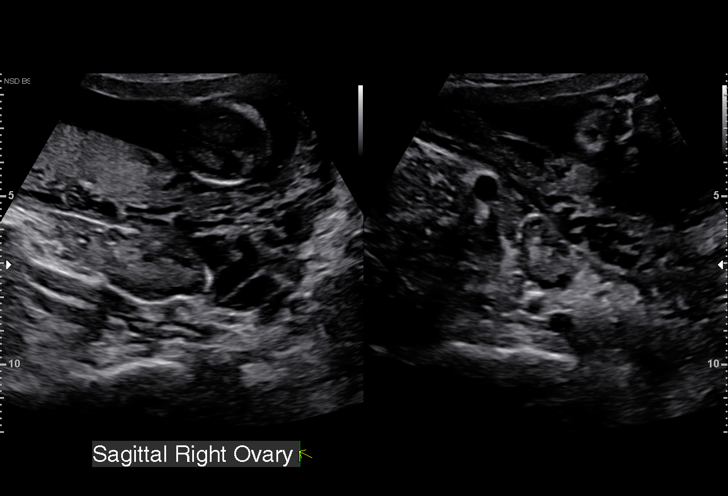
[im 19/21]
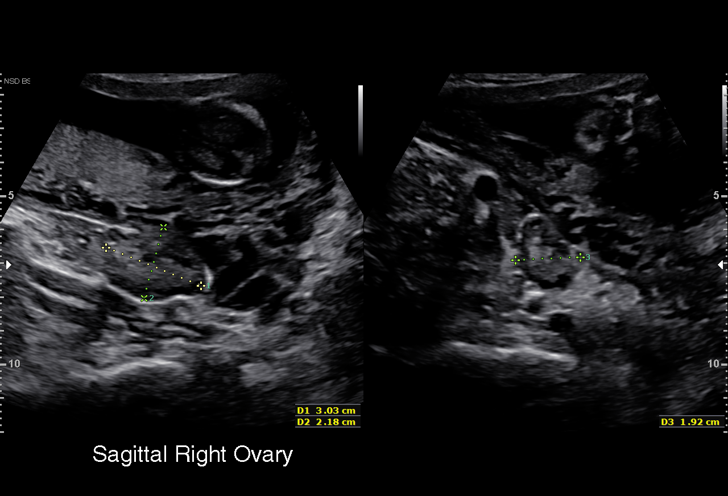
[im 21/21]
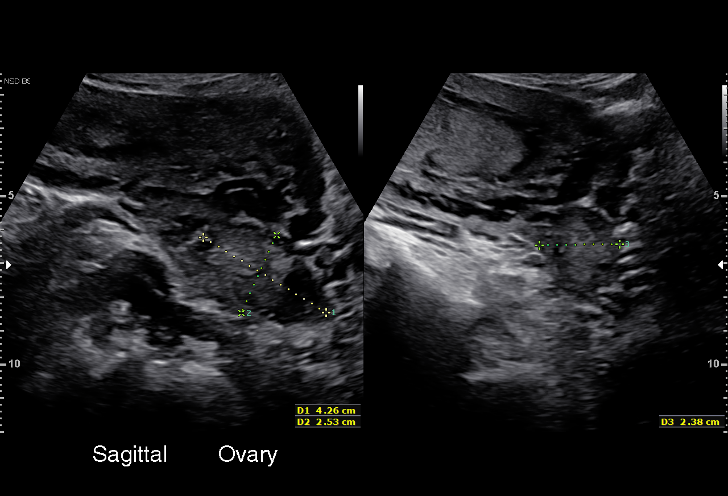

[15 of 21 positions shown; findings below may reference images not displayed]

[REDACTED]-
                   Faculty Physician

  1  US MFM OB COMP LESS THAN             76801.4      CHIE
     14 WEEKS
 ----------------------------------------------------------------------

 ----------------------------------------------------------------------
Indications

  Previous child with congenital anomaly,        [1D]
  antepartum (Renal anomalies and
  Polydactyly)
  13 weeks gestation of pregnancy
  Advanced maternal age multigravida 35+,        [1D]
  first trimester
 ----------------------------------------------------------------------
Fetal Evaluation

 Num Of Fetuses:         1
 Fetal Heart Rate(bpm):  157
 Cardiac Activity:       Observed
 Presentation:           Transverse, head to maternal right
 Placenta:               Posterior

 Amniotic Fluid
 AFI FV:      Within normal limits
Biometry

 CRL:      85.2  mm     G. Age:  14w 0d                  EDD:   [DATE]
OB History

 Gravidity:    3         Term:   2        Prem:   0        SAB:   0
 TOP:          0       Ectopic:  0        Living: 2
Gestational Age
 LMP:           13w 6d        Date:  [DATE]                 EDD:   [DATE]
 Best:          13w 6d     Det. By:  LMP  ([DATE])          EDD:   [DATE]
Anatomy

 Stomach:               Appears normal, left   Kidneys:                Appear normal
                        sided
Cervix Uterus Adnexa

 Cervix
 Normal appearance by transabdominal scan.

 Left Ovary
 Within normal limits.

 Right Ovary
 Within normal limits.

 Adnexa
 No abnormality visualized.
Impression

 Limited exam due to gestational age
 Too late for NT opted for cell free DNA
 Genetic counseling today
Recommendations

 Follow up scheduled at 18-20 weeks gestation.

## 2019-07-26 ENCOUNTER — Telehealth (HOSPITAL_COMMUNITY): Payer: Self-pay | Admitting: Genetic Counselor

## 2019-07-26 NOTE — Telephone Encounter (Signed)
I called Maria Gallagher with Maria Gallagher, Spanish interpreter to discuss her negative noninvasive prenatal screening (NIPS)/cell free DNA (cfDNA) testing result. Specifically, Maria Gallagher  had MaterniT21 testing through [place] was offered because Maria Gallagher will be considered AMA at delivery. These negative results demonstrated an expected representation of chromosome 21, 18, and 13 material, greatly reducing the likelihood of trisomies 21, 13, or 18 for the pregnancy. Maria Gallagher requested to know about the expected fetal sex, which was predicted to be female.  NIPS analyzes placental (fetal) DNA in maternal circulation. NIPS is considered to be highly specific and sensitive, but is not considered to be a diagnostic test. We reviewed that this testing identifies the majority of pregnancies with trisomies 68, 57, and 60, as well as the sex of the baby. Maria Gallagher was reminded that diagnostic testing via amniocentesis is available should she be interested in confirming this result.   Maria Gallagher was also informed that the results from her Inheritest carrier screening are still pending. I will call her with those results when they come back from the laboratory.  Maria Gallagher confirmed that she had no questions about these results at this time.  Buelah Manis, MS Genetic Counselor

## 2019-08-01 LAB — INHERITEST CORE(CF97,SMA,FRAX)

## 2019-08-01 LAB — MATERNIT 21 PLUS CORE, BLOOD
Fetal Fraction: 15
Result (T21): NEGATIVE
Trisomy 13 (Patau syndrome): NEGATIVE
Trisomy 18 (Edwards syndrome): NEGATIVE
Trisomy 21 (Down syndrome): NEGATIVE

## 2019-12-29 NOTE — L&D Delivery Note (Signed)
OB/GYN Faculty Practice Delivery Note  Maria Gallagher is a 34 y.o. [id number] s/p NSVD at [redacted]w[redacted]d. She was admitted for SOL.   ROM: 2 minutes with clear fluid GBS Status: negative Maximum Maternal Temperature: 99.2  Labor Progress: . She was admitted in active labor and progressed to 9.5 cm. AROM was performed and she delivered shortly thereafter.  Delivery Date/Time: 12/30/2019, 1506 hours Delivery: Head delivered ROA. No nuchal cord present. Shoulder and body delivered in usual fashion. Infant with spontaneous cry, placed on mother's abdomen, dried and stimulated. Cord clamped x 2 after 1-minute delay, and cut by FOB under my direct supervision. Cord blood drawn. Placenta delivered spontaneously with gentle cord traction. Fundus firm with massage and Pitocin. Labia, perineum, vagina, and cervix were inspected, and found to be intact with a left periurethral scrape that was hemostatic and did not require repair.   Placenta: intact, 3 vessel cord, to L&D Complications: none Lacerations: left periurethral scrape EBL: 125 mL Analgesia: None  Postpartum Planning [x]  message to sent to schedule follow-up  [x]  vaccines UTD  Infant: female  APGARs 10, 10  weight per medical record  , DO OB/GYN Fellow, Faculty Practice

## 2019-12-30 ENCOUNTER — Other Ambulatory Visit: Payer: Self-pay

## 2019-12-30 ENCOUNTER — Inpatient Hospital Stay (HOSPITAL_COMMUNITY)
Admission: AD | Admit: 2019-12-30 | Discharge: 2020-01-01 | DRG: 807 | Disposition: A | Discharge status: Home / Self Care | Payer: Medicaid Other | Attending: Obstetrics & Gynecology | Admitting: Obstetrics & Gynecology

## 2019-12-30 ENCOUNTER — Encounter (HOSPITAL_COMMUNITY): Payer: Self-pay | Admitting: Obstetrics & Gynecology

## 2019-12-30 DIAGNOSIS — Z20822 Contact with and (suspected) exposure to covid-19: Secondary | ICD-10-CM | POA: Diagnosis present

## 2019-12-30 DIAGNOSIS — Z3A38 38 weeks gestation of pregnancy: Secondary | ICD-10-CM

## 2019-12-30 DIAGNOSIS — O26893 Other specified pregnancy related conditions, third trimester: Secondary | ICD-10-CM | POA: Diagnosis present

## 2019-12-30 LAB — CBC
HCT: 36.5 % (ref 36.0–46.0)
Hemoglobin: 11.5 g/dL — ABNORMAL LOW (ref 12.0–15.0)
MCH: 25.6 pg — ABNORMAL LOW (ref 26.0–34.0)
MCHC: 31.5 g/dL (ref 30.0–36.0)
MCV: 81.1 fL (ref 80.0–100.0)
Platelets: 323 10*3/uL (ref 150–400)
RBC: 4.5 MIL/uL (ref 3.87–5.11)
RDW: 15 % (ref 11.5–15.5)
WBC: 14.1 10*3/uL — ABNORMAL HIGH (ref 4.0–10.5)
nRBC: 0 % (ref 0.0–0.2)

## 2019-12-30 LAB — GROUP B STREP BY PCR: Group B strep by PCR: NEGATIVE

## 2019-12-30 LAB — RESPIRATORY PANEL BY RT PCR (FLU A&B, COVID)
Influenza A by PCR: NEGATIVE
Influenza B by PCR: NEGATIVE
SARS Coronavirus 2 by RT PCR: NEGATIVE

## 2019-12-30 MED ORDER — ONDANSETRON HCL 4 MG/2ML IJ SOLN: 4.0000 mg | INTRAMUSCULAR | Status: DC | PRN

## 2019-12-30 MED ORDER — SENNOSIDES-DOCUSATE SODIUM 8.6-50 MG PO TABS
2.0000 | ORAL_TABLET | ORAL | Status: DC
  Administered 2019-12-30 – 2019-12-31 (×2): 2 via ORAL
  Filled 2019-12-30 (×2): qty 2

## 2019-12-30 MED ORDER — IBUPROFEN 600 MG PO TABS
600.0000 mg | ORAL_TABLET | Freq: Four times a day (QID) | ORAL | Status: DC
  Administered 2019-12-30 – 2020-01-01 (×7): 600 mg via ORAL
  Filled 2019-12-30 (×8): qty 1

## 2019-12-30 MED ORDER — SODIUM CHLORIDE 0.9 % IV SOLN
2.0000 g | Freq: Once | INTRAVENOUS | Status: AC
  Administered 2019-12-30: 2 g via INTRAVENOUS
  Filled 2019-12-30: qty 2000

## 2019-12-30 MED ORDER — SIMETHICONE 80 MG PO CHEW: 80.0000 mg | CHEWABLE_TABLET | ORAL | Status: DC | PRN

## 2019-12-30 MED ORDER — DIPHENHYDRAMINE HCL 25 MG PO CAPS: 25.0000 mg | ORAL_CAPSULE | Freq: Four times a day (QID) | ORAL | Status: DC | PRN

## 2019-12-30 MED ORDER — ONDANSETRON HCL 4 MG PO TABS: 4.0000 mg | ORAL_TABLET | ORAL | Status: DC | PRN

## 2019-12-30 MED ORDER — LACTATED RINGERS IV SOLN: 500.0000 mL | INTRAVENOUS | Status: DC | PRN

## 2019-12-30 MED ORDER — OXYCODONE-ACETAMINOPHEN 5-325 MG PO TABS: 1.0000 | ORAL_TABLET | ORAL | Status: DC | PRN

## 2019-12-30 MED ORDER — TETANUS-DIPHTH-ACELL PERTUSSIS 5-2.5-18.5 LF-MCG/0.5 IM SUSP: 0.5000 mL | Freq: Once | INTRAMUSCULAR | Status: DC

## 2019-12-30 MED ORDER — SOD CITRATE-CITRIC ACID 500-334 MG/5ML PO SOLN: 30.0000 mL | ORAL | Status: DC | PRN

## 2019-12-30 MED ORDER — OXYCODONE HCL 5 MG PO TABS: 10.0000 mg | ORAL_TABLET | ORAL | Status: DC | PRN

## 2019-12-30 MED ORDER — ONDANSETRON HCL 4 MG/2ML IJ SOLN: 4.0000 mg | Freq: Four times a day (QID) | INTRAMUSCULAR | Status: DC | PRN

## 2019-12-30 MED ORDER — OXYTOCIN BOLUS FROM INFUSION: 500.0000 mL | Freq: Once | INTRAVENOUS | Status: AC

## 2019-12-30 MED ORDER — PRENATAL MULTIVITAMIN CH
1.0000 | ORAL_TABLET | Freq: Every day | ORAL | Status: DC
  Administered 2019-12-31 – 2020-01-01 (×2): 1 via ORAL
  Filled 2019-12-30 (×2): qty 1

## 2019-12-30 MED ORDER — ACETAMINOPHEN 325 MG PO TABS: 650.0000 mg | ORAL_TABLET | ORAL | Status: DC | PRN

## 2019-12-30 MED ORDER — COCONUT OIL OIL: 1.0000 "application " | TOPICAL_OIL | Status: DC | PRN

## 2019-12-30 MED ORDER — FENTANYL CITRATE (PF) 100 MCG/2ML IJ SOLN: 100.0000 ug | INTRAMUSCULAR | Status: DC | PRN

## 2019-12-30 MED ORDER — LACTATED RINGERS IV SOLN: INTRAVENOUS | Status: DC

## 2019-12-30 MED ORDER — BENZOCAINE-MENTHOL 20-0.5 % EX AERO: 1.0000 "application " | INHALATION_SPRAY | CUTANEOUS | Status: DC | PRN

## 2019-12-30 MED ORDER — FENTANYL CITRATE (PF) 100 MCG/2ML IJ SOLN
INTRAMUSCULAR | Status: AC
  Administered 2019-12-30: 13:00:00 100 ug via INTRAVENOUS
  Filled 2019-12-30: qty 2

## 2019-12-30 MED ORDER — ZOLPIDEM TARTRATE 5 MG PO TABS: 5.0000 mg | ORAL_TABLET | Freq: Every evening | ORAL | Status: DC | PRN

## 2019-12-30 MED ORDER — DIBUCAINE (PERIANAL) 1 % EX OINT: 1.0000 "application " | TOPICAL_OINTMENT | CUTANEOUS | Status: DC | PRN

## 2019-12-30 MED ORDER — OXYTOCIN 40 UNITS IN NORMAL SALINE INFUSION - SIMPLE MED: 2.5000 [IU]/h | INTRAVENOUS | Status: DC

## 2019-12-30 MED ORDER — WITCH HAZEL-GLYCERIN EX PADS: 1.0000 "application " | MEDICATED_PAD | CUTANEOUS | Status: DC | PRN

## 2019-12-30 MED ORDER — LIDOCAINE HCL (PF) 1 % IJ SOLN: 30.0000 mL | INTRAMUSCULAR | Status: DC | PRN

## 2019-12-30 MED ORDER — OXYCODONE-ACETAMINOPHEN 5-325 MG PO TABS: 2.0000 | ORAL_TABLET | ORAL | Status: DC | PRN

## 2019-12-30 MED ORDER — OXYTOCIN 40 UNITS IN NORMAL SALINE INFUSION - SIMPLE MED
INTRAVENOUS | Status: AC
  Administered 2019-12-30: 15:00:00 500 mL via INTRAVENOUS
  Filled 2019-12-30: qty 1000

## 2019-12-30 MED ORDER — OXYCODONE HCL 5 MG PO TABS: 5.0000 mg | ORAL_TABLET | ORAL | Status: DC | PRN

## 2019-12-30 NOTE — Lactation Note (Signed)
This note was copied from a baby's chart. Lactation Consultation Note  Patient Name: Maria Gallagher HDTPN'S Date: 12/30/2019   Spoke to RN Judeth Cornfield and she reported to [facility] that mom put baby to breast in L & D at least twice but it wasn't documented. Mom came as formula only and she's Spanish speaker, RN believes mom only put baby to breast because she was cueing. Let RN know to notify lactation in case mom changes her mind and decides to BF, but so far her feeding choice on admission is to do just formula. RN will ask mom if she would like to be seen by lactation.    Maternal Data    Feeding Feeding Type: Bottle Fed - Formula Nipple Type: Slow - flow  LATCH Score Latch: Grasps breast easily, tongue down, lips flanged, rhythmical sucking.  Audible Swallowing: A few with stimulation  Type of Nipple: Everted at rest and after stimulation  Comfort (Breast/Nipple): Soft / non-tender  Hold (Positioning): No assistance needed to correctly position infant at breast.  LATCH Score: 9  Interventions    Lactation Tools Discussed/Used     Consult Status      [name] 12/30/2019, 6:11 PM

## 2019-12-30 NOTE — MAU Note (Signed)
Pt states she started having contractions around 0700 this morning that have gotten stronger and closer together, denies LOF/VB. Reports good fetal movement. Denies complications with the pregnancy.

## 2019-12-30 NOTE — Discharge Summary (Signed)
Postpartum Discharge Summary  Patient Name: Maria Gallagher DOB: May 04, 1986 MRN: [id number]  Date of admission: 12/30/2019 Delivering Provider: Merilyn Baba   Date of discharge: 01/01/2020  Admitting diagnosis: Indication for care in labor and delivery, antepartum [O75.9] Intrauterine pregnancy: [redacted]w[redacted]d    Secondary diagnosis:  Active Problems:   Indication for care in labor and delivery, antepartum   SVD (spontaneous vaginal delivery)  Additional problems: Normal labor     Discharge diagnosis: Term Pregnancy Delivered                                                                                                Post partum procedures: none  Augmentation: AROM  Complications: None  Hospital course:  Onset of Labor With Vaginal Delivery     34y.o. yo G3P2002 at 320w2das admitted in Active Labor on 12/30/2019. Patient had an uncomplicated labor course as follows:   She progressed to 9.5 cm. AROM was performed and she delivered shortly after. Planned IP Nexplanon, but does not have Medicaid or qualify for grant. Patient plans on receiving Nexplanon OP at health department.   Membrane Rupture Time/Date: 3:06 PM ,12/30/2019   Intrapartum Procedures: Episiotomy: None [1]                                         Lacerations:  None [1];Periurethral [8]  Patient had a delivery of a Viable infant. 12/30/2019  Information for the patient's newborn:  MeMillette, Halberstam0[[id number]]Delivery Method: Vag-Spont     Pateint had an uncomplicated postpartum course.  She is ambulating, tolerating a regular diet, passing flatus, and urinating well. Patient is discharged home in stable condition on 01/01/20.  Delivery time: 3:06 PM    Magnesium Sulfate received: No BMZ received: No Rhophylac:N/A MMR:N/A Transfusion:No  Physical exam  Vitals:   12/31/19 0525 12/31/19 1625 12/31/19 2158 01/01/20 0500  BP: 100/71 120/78 101/67 116/75  Pulse: 79 78 79 71  Resp: '18 16 18 16  '$ Temp:  98.1 F (36.7 C) 98.2 F (36.8 C) 98.7 F (37.1 C) 97.8 F (36.6 C)  TempSrc: Oral Oral Oral Oral  SpO2: 100% 100%  100%  Weight:      Height:       General: alert, cooperative and no distress Lochia: appropriate Uterine Fundus: firm Incision: N/A DVT Evaluation: No evidence of DVT seen on physical exam. No significant calf/ankle edema. Labs: Lab Results  Component Value Date   WBC 16.0 (H) 12/31/2019   HGB 10.0 (L) 12/31/2019   HCT 31.7 (L) 12/31/2019   MCV 81.1 12/31/2019   PLT 279 12/31/2019   CMP Latest Ref Rng & Units 03/19/2017  Glucose 65 - 99 mg/dL 81  BUN 7 - 25 mg/dL 11  Creatinine 0.50 - 1.10 mg/dL 0.69  Sodium 135 - 146 mmol/L 139  Potassium 3.5 - 5.3 mmol/L 4.4  Chloride 98 - 110 mmol/L 103  CO2 20 - 31 mmol/L 24  Calcium 8.6 - 10.2 mg/dL  9.3  Total Protein 6.1 - 8.1 g/dL 7.2  Total Bilirubin 0.2 - 1.2 mg/dL 0.5  Alkaline Phos 33 - 115 U/L 68  AST 10 - 30 U/L 15  ALT 6 - 29 U/L 11    Discharge instruction: per After Visit Summary and "Baby and Me Booklet".  After visit meds:  Allergies as of 01/01/2020   No Known Allergies     Medication List    TAKE these medications   cyclobenzaprine 10 MG tablet Commonly known as: FLEXERIL Take 1 tablet (10 mg total) by mouth 2 (two) times daily as needed for muscle spasms.   ibuprofen 600 MG tablet Commonly known as: ADVIL Take 1 tablet (600 mg total) by mouth every 6 (six) hours as needed for moderate pain.   PNV PO Take by mouth.   traZODone 50 MG tablet Commonly known as: DESYREL Take 1 tablet (50 mg total) by mouth at bedtime as needed for sleep.       Diet: routine diet  Activity: Advance as tolerated. Pelvic rest for 6 weeks.   Outpatient follow up:4 weeks Follow up Appt:No future appointments. Follow up Visit: Follow-up Information    Department, [facility]. Schedule an appointment as soon as possible for a visit in 6 week(s).   Why: Make appointment to be seen in 6 weeks  for postpartum appointment and Nexplanon insertion Contact information: [address] [place] [address] [phone]            Please schedule this patient for Postpartum visit in: 4 weeks with the following provider: Any provider For C/S patients schedule nurse incision check in weeks 2 weeks: no Low risk pregnancy complicated by: None Delivery mode:  SVD Anticipated Birth Control:  Nexplanon outpatient  PP Procedures needed: None  Schedule Integrated BH visit: no      Newborn Data: Live born female  Birth Weight:  3050g (6lb 11.6oz) APGAR: 67, 10  Newborn Delivery   Birth date/time: 12/30/2019 15:06:00 Delivery type:       Baby Feeding: Bottle Disposition:home with mother   01/01/2020 Lajean Manes, CNM

## 2019-12-30 NOTE — H&P (Signed)
OBSTETRIC ADMISSION HISTORY AND PHYSICAL  Tanaka Gillen is a 34 y.o. female [phone] with IUP at [redacted]w[redacted]d presenting for SOL with CTX starting at 0700. She reports +FMs. No LOF, VB, blurry vision, headaches, peripheral edema, or RUQ pain. She plans on bottlefeeding. She requests Nexplanon for birth control.  Dating: By LMP c/w first trimester [place] --->  Estimated Date of Delivery: 01/11/20  Sono:   No [place] on file from HD records   Prenatal History/Complications: Abnormal Pap- ASCUS, HPV(-) H/o Leep in 2009 Varicella Non-immune  Past Medical History: Past Medical History:  Diagnosis Date  . Medical history non-contributory   . Vaginal Pap smear, abnormal     Past Surgical History: Past Surgical History:  Procedure Laterality Date  . CERVICAL CONE BIOPSY      Obstetrical History: OB History    Gravida  3   Para  2   Term  2   Preterm  0   AB  0   Living  2     SAB  0   TAB  0   Ectopic  0   Multiple  0   Live Births  1           Social History: Social History   Socioeconomic History  . Marital status: Single    Spouse name: Not on file  . Number of children: Not on file  . Years of education: Not on file  . Highest education level: Not on file  Occupational History  . Not on file  Tobacco Use  . Smoking status: Never Smoker  . Smokeless tobacco: Never Used  Substance and Sexual Activity  . Alcohol use: No  . Drug use: No  . Sexual activity: Yes    Birth control/protection: None  Other Topics Concern  . Not on file  Social History Narrative  . Not on file   Social Determinants of Health   Financial Resource Strain:   . Difficulty of Paying Living Expenses: Not on file  Food Insecurity:   . Worried About Programme researcher, broadcasting/film/video in the Last Year: Not on file  . Ran Out of Food in the Last Year: Not on file  Transportation Needs:   . Lack of Transportation (Medical): Not on file  . Lack of Transportation (Non-Medical): Not on file   Physical Activity:   . Days of Exercise per Week: Not on file  . Minutes of Exercise per Session: Not on file  Stress:   . Feeling of Stress : Not on file  Social Connections:   . Frequency of Communication with Friends and Family: Not on file  . Frequency of Social Gatherings with Friends and Family: Not on file  . Attends Religious Services: Not on file  . Active Member of Clubs or Organizations: Not on file  . Attends Banker Meetings: Not on file  . Marital Status: Not on file    Family History: History reviewed. No pertinent family history.  Allergies: No Known Allergies  Medications Prior to Admission  Medication Sig Dispense Refill Last Dose  . cyclobenzaprine (FLEXERIL) 10 MG tablet Take 1 tablet (10 mg total) by mouth 2 (two) times daily as needed for muscle spasms. (Patient not taking: Reported on 03/19/2017) 60 tablet 1   . Prenatal Vit w/Fe-Methylfol-FA (PNV PO) Take by mouth.     . traZODone (DESYREL) 50 MG tablet Take 1 tablet (50 mg total) by mouth at bedtime as needed for sleep. (Patient not taking: Reported  on 05/05/2017) 30 tablet 2      Review of Systems:  All systems reviewed and negative except as stated in HPI  PE: Blood pressure 118/81, pulse (!) 108, temperature 98 F (36.7 C), temperature source Oral, resp. rate 16, last menstrual period 04/06/2019, SpO2 100 %, unknown if currently breastfeeding. General appearance: alert and cooperative Lungs: regular rate and effort Heart: regular rate  Abdomen: soft, non-tender Extremities: Homans sign is negative, no sign of DVT Presentation: cephalic EFM: 347 bpm, moderate variability, 15x15 accels, no decels Toco: not picking up contractions Dilation: 6.5 Effacement (%): 90 Station: -1 Exam by:: Jannifer Franklin, RN  Prenatal labs: ABO, Rh: --/--/B POS (01/02 1303)B pos Antibody: PENDING (01/02 1303)Negative Rubella:  Immune RPR:   Negative HBsAg:   Negative HIV:   Non-reactive GBS:    unknown 1 hr GTT 78- negative  Prenatal Transfer Tool  Maternal Diabetes: No Genetic Screening: Normal Maternal Ultrasounds/Referrals: Normal Fetal Ultrasounds or other Referrals:  None Maternal Substance Abuse:  No Significant Maternal Medications:  None Significant Maternal Lab Results: None  Results for orders placed or performed during the hospital encounter of 12/30/19 (from the past 24 hour(s))  CBC   Collection Time: 12/30/19 12:59 PM  Result Value Ref Range   WBC 14.1 (H) 4.0 - 10.5 K/uL   RBC 4.50 3.87 - 5.11 MIL/uL   Hemoglobin 11.5 (L) 12.0 - 15.0 g/dL   HCT 36.5 36.0 - 46.0 %   MCV 81.1 80.0 - 100.0 fL   MCH 25.6 (L) 26.0 - 34.0 pg   MCHC 31.5 30.0 - 36.0 g/dL   RDW 15.0 11.5 - 15.5 %   Platelets 323 150 - 400 K/uL   nRBC 0.0 0.0 - 0.2 %  Type and screen [place]   Collection Time: 12/30/19  1:03 PM  Result Value Ref Range   ABO/RH(D) B POS    Antibody Screen PENDING    Sample Expiration      01/02/2020,2359 Performed at [facility], [address] [place], [place] [address]     Patient Active Problem List   Diagnosis Date Noted  . Indication for care in labor and delivery, antepartum 12/30/2019  . Admitted to labor and delivery 08/20/2018  . NSVD (normal spontaneous vaginal delivery) 08/20/2018    Assessment: Lanelle Lindo is a 34 y.o. G3P2002 at [redacted]w[redacted]d here for SOL  1. Labor: expectant management. Consider AROM v Pitocin if appropriate. 2. FWB: Cat I 3. Pain: per patient request 4. GBS: unknown, PCR sent.   Plan: Admit to L&D. Anticipate NSVD.  Jann Ra L Lyberti Thrush, DO  12/30/2019, 1:46 PM

## 2019-12-31 ENCOUNTER — Encounter (HOSPITAL_COMMUNITY): Payer: Self-pay | Admitting: Obstetrics & Gynecology

## 2019-12-31 LAB — CBC
HCT: 31.7 % — ABNORMAL LOW (ref 36.0–46.0)
Hemoglobin: 10 g/dL — ABNORMAL LOW (ref 12.0–15.0)
MCH: 25.6 pg — ABNORMAL LOW (ref 26.0–34.0)
MCHC: 31.5 g/dL (ref 30.0–36.0)
MCV: 81.1 fL (ref 80.0–100.0)
Platelets: 279 10*3/uL (ref 150–400)
RBC: 3.91 MIL/uL (ref 3.87–5.11)
RDW: 15.2 % (ref 11.5–15.5)
WBC: 16 10*3/uL — ABNORMAL HIGH (ref 4.0–10.5)
nRBC: 0 % (ref 0.0–0.2)

## 2019-12-31 MED ORDER — MEDROXYPROGESTERONE ACETATE 150 MG/ML IM SUSP: 150.0000 mg | Freq: Once | INTRAMUSCULAR | Status: DC | PRN

## 2019-12-31 MED ORDER — LIDOCAINE HCL 1 % IJ SOLN
0.0000 mL | Freq: Once | INTRAMUSCULAR | Status: DC | PRN
  Filled 2019-12-31: qty 20

## 2019-12-31 MED ORDER — ETONOGESTREL 68 MG ~~LOC~~ IMPL
68.0000 mg | DRUG_IMPLANT | Freq: Once | SUBCUTANEOUS | Status: DC
  Filled 2019-12-31: qty 1

## 2019-12-31 NOTE — Progress Notes (Signed)
Post Partum Day 1 Subjective: no complaints, up ad lib, voiding, tolerating PO and + flatus.  Planned IP Nexplanon, but does not have Medicaid or qualify for grant.   Objective: Blood pressure 100/71, pulse 79, temperature 98.1 F (36.7 C), temperature source Oral, resp. rate 18, height 5\' 3"  (1.6 m), weight 79.4 kg, last menstrual period 04/06/2019, SpO2 100 %, unknown if currently breastfeeding.  Physical Exam:  General: alert, cooperative, appears stated age and no distress Lochia: appropriate Uterine Fundus: firm Incision: NA DVT Evaluation: Negative Homan's sign. No cords or calf tenderness. No significant calf/ankle edema.  Recent Labs    12/30/19 1259 12/31/19 0237  HGB 11.5* 10.0*  HCT 36.5 31.7*    Assessment/Plan: Plan for discharge tomorrow and Contraception undecided btw Nexplanon OP ([facility]) or Depo IP.      LOS: 1 day   02/28/20 12/31/2019, 11:59 AM

## 2020-01-01 MED ORDER — IBUPROFEN 600 MG PO TABS: 600.0000 mg | ORAL_TABLET | Freq: Four times a day (QID) | ORAL | 0 refills | Status: AC | PRN

## 2020-01-01 MED FILL — IBUPROFEN 600 MG TABLET: 600 | 7 days supply | Qty: 30 | Fill #0

## 2020-01-01 NOTE — Discharge Instructions (Signed)
Parto vaginal, cuidados de puerperio Postpartum Care After Vaginal Delivery Lea esta informacin sobre cmo cuidarse desde el momento en que nazca su beb y hasta 6 a 12 semanas despus del parto (perodo del posparto). El mdico tambin podr darle instrucciones ms especficas. Comunquese con su mdico si tiene problemas o preguntas. Siga estas indicaciones en su casa: Hemorragia vaginal  Es normal tener un poco de hemorragia vaginal (loquios) despus del parto. Use un apsito sanitario para el sangrado vaginal y secrecin. ? Durante la primera semana despus del parto, la cantidad y el aspecto de los loquios a menudo es similar a las del perodo menstrual. ? Durante las siguientes semanas disminuir gradualmente hasta convertirse en una secrecin seca amarronada o amarillenta. ? En la mayora de las mujeres, los loquios se detienen completamente entre 4 a 6semanas despus del parto. Los sangrados vaginales pueden variar de mujer a mujer.  Cambie los apsitos sanitarios con frecuencia. Observe si hay cambios en el flujo, como: ? Un aumento repentino en el volumen. ? Cambio en el color. ? Cogulos sanguneos grandes.  Si expulsa un cogulo de sangre por la vagina, gurdelo y llame al mdico para informrselo. No deseche los cogulos de sangre por el inodoro antes de hablar con su mdico.  No use tampones ni se haga duchas vaginales hasta que el mdico la autorice.  Si no est amamantando, volver a tener su perodo entre 6 y 8 semanas despus del parto. Si solamente alimenta al beb con leche materna (lactancia materna exclusiva), podra no volver a tener su perodo hasta que deje de amamantar. Cuidados perineales  Mantenga la zona entre la vagina y el ano (perineo) limpia y seca, como se lo haya indicado el mdico. Utilice apsitos o aerosoles analgsicos y cremas, como se lo hayan indicado.  Si le hicieron un corte en el perineo (episiotoma) o tuvo un desgarro en la vagina, controle la  zona para detectar signos de infeccin hasta que sane. Est atenta a los siguientes signos: ? Aumento del enrojecimiento, la hinchazn o el dolor. ? Presenta lquido o sangre que supura del corte o desgarro. ? Calor. ? Pus o mal olor.  Es posible que le den una botella rociadora para que use en lugar de limpiarse el rea con papel higinico despus de usar el bao. Cuando comience a sanar, podr usar la botella rociadora antes de secarse. Asegrese de secarse suavemente.  Para aliviar el dolor causado por una episiotoma, un desgarro en la vagina o venas hinchadas en el ano (hemorroides), trate de tomar un bao de asiento tibio 2 o 3 veces por da. Un bao de asiento es un bao de agua tibia que se toma mientras se est sentado. El agua solo debe llegar hasta las caderas y cubrir las nalgas. Cuidado de las mamas  En los primeros das despus del parto, las mamas pueden sentirse pesadas, llenas e incmodas (congestin mamaria). Tambin puede escaparse leche de sus senos. El mdico puede sugerirle mtodos para aliviar este malestar. La congestin mamaria debera desaparecer al cabo de unos das.  Si est amamantando: ? Use un sostn que sujete y ajuste bien sus pechos. ? Mantenga los pezones secos y limpios. Aplquese cremas y ungentos, como se lo haya indicado el mdico. ? Es posible que deba usar discos de algodn en el sostn para absorber la leche que se filtre de sus senos. ? Puede tener contracciones uterinas cada vez que amamante durante varias semanas despus del parto. Las contracciones uterinas ayudan al tero a   regresar a su tamao habitual. ? Si tiene algn problema con la lactancia materna, colabore con el mdico o un asesor en lactancia.  Si no est amamantando: ? Evite tocarse mucho las mamas. Al hacerlo, podran producir ms leche. ? Use un sostn que le proporcione el ajuste correcto y compresas fras para reducir la hinchazn. ? No extraiga (saque) leche materna. Esto har que  produzca ms leche. Intimidad y sexualidad  Pregntele al mdico cundo puede retomar la actividad sexual. Esto puede depender de lo siguiente: ? Su riesgo de sufrir infecciones. ? La rapidez con la que est sanando. ? Su comodidad y deseo de retomar la actividad sexual.  Despus del parto, puede quedar embarazada incluso si no ha tenido todava su perodo. Si lo desea, hable con el mdico acerca de los mtodos de control de la natalidad (mtodos anticonceptivos). Medicamentos  Tome los medicamentos de venta libre y los recetados solamente como se lo haya indicado el mdico.  Si le recetaron un antibitico, tmelo como se lo haya indicado el mdico. No deje de tomar el antibitico aunque comience a sentirse mejor. Actividad  Retome sus actividades normales de a poco como se lo haya indicado el mdico. Pregntele al mdico qu actividades son seguras para usted.  Descanse todo lo que pueda. Trate de descansar o tomar una siesta mientras el beb duerme. Comida y bebida   Beba suficiente lquido como para mantener la orina de color amarillo plido.  Coma alimentos ricos en fibras todos los das. Estos pueden ayudarla a prevenir o aliviar el estreimiento. Los alimentos ricos en fibras incluyen, entre otros: ? Panes y cereales integrales. ? Arroz integral. ? Frijoles. ? Frutas y verduras frescas.  No intente perder de peso rpidamente reduciendo el consumo de caloras.  Tome sus vitaminas prenatales hasta la visita de seguimiento de posparto o hasta que su mdico le indique que puede dejar de tomarlas. Estilo de vida  No consuma ningn producto que contenga nicotina o tabaco, como cigarrillos y cigarrillos electrnicos. Si necesita ayuda para dejar de fumar, consulte al mdico.  No beba alcohol, especialmente si est amamantando. Instrucciones generales  Concurra a todas las visitas de seguimiento para usted y el beb, como se lo haya indicado el mdico. La mayora de las mujeres  visita al mdico para un seguimiento de posparto dentro de las primeras 3 a 6 semanas despus del parto. Comunquese con un mdico si:  Se siente incapaz de controlar los cambios que implica tener un hijo y esos sentimientos no desaparecen.  Siente tristeza o preocupacin de forma inusual.  Las mamas se ponen rojas, le duelen o se endurecen.  Tiene fiebre.  Tiene dificultad para retener la orina o para impedir que la orina se escape.  Tiene poco inters o falta de inters en actividades que solan gustarle.  No ha amamantado nada y no ha tenido un perodo menstrual durante 12 semanas despus del parto.  Dej de amamantar al beb y no ha tenido su perodo menstrual durante 12 semanas despus de dejar de amamantar.  Tiene preguntas sobre su cuidado y el del beb.  Elimina un cogulo de sangre grande por la vagina. Solicite ayuda de inmediato si:  Siente dolor en el pecho.  Tiene dificultad para respirar.  Tiene un dolor repentino e intenso en la pierna.  Tiene dolor intenso o clicos en el la parte inferior del abdomen.  Tiene una hemorragia tan intensa de la vagina que empapa ms de un apsito en una hora. El sangrado   no debe ser ms abundante que el perodo ms intenso que haya tenido.  Dolor de cabeza intenso.  Se desmaya.  Tiene visin borrosa o Nurse, adult.  Tiene secrecin vaginal con mal olor.  Tiene pensamientos acerca de lastimarse a usted misma o a su beb. Si alguna vez siente que puede lastimarse a usted misma o a Economist, o tiene pensamientos de poner fin a su vida, busque ayuda de inmediato. Puede dirigirse al departamento de emergencias ms cercano o llamar a:  El servicio de emergencias de su localidad (911 en [place]  Una lnea de asistencia al suicida y Visual merchandiser en crisis, como la [employer] de Prevencin del Suicidio (National Suicide Prevention Lifeline), al [phone]. Est disponible las 24 horas del da. Resumen  El  perodo de tiempo justo despus el parto y Elaina Hoops 6 a 12 semanas despus del parto se denomina perodo posparto.  Retome sus actividades normales de a Naval architect se lo haya indicado el mdico.  Concurra a todas las visitas de seguimiento para usted y Dance movement psychotherapist beb, como se lo haya indicado el mdico. Esta informacin no tiene Theme park manager el consejo del mdico. Asegrese de hacerle al mdico cualquier pregunta que tenga. Document Revised: 03/26/2018 Document Reviewed: 12/05/2017 Elsevier Patient Education  2020 [employer].  For assistance with applying for medicaid please call Fausto Skillern [phone] or Anne Hahn [phone].

## 2020-01-02 LAB — TYPE AND SCREEN
ABO/RH(D): B POS
Antibody Screen: POSITIVE
Donor AG Type: NEGATIVE
Donor AG Type: NEGATIVE
PT AG Type: NEGATIVE
Unit division: 0
Unit division: 0

## 2020-01-02 LAB — BPAM RBC
Blood Product Expiration Date: 202101262359
Blood Product Expiration Date: 202101262359
Unit Type and Rh: 7300
Unit Type and Rh: 7300

## 2020-01-02 LAB — RPR: RPR Ser Ql: NONREACTIVE

## 2020-01-08 ENCOUNTER — Encounter: Payer: Self-pay | Admitting: Obstetrics and Gynecology

## 2020-01-08 DIAGNOSIS — O360990 Maternal care for other rhesus isoimmunization, unspecified trimester, not applicable or unspecified: Secondary | ICD-10-CM | POA: Insufficient documentation

## 2020-06-27 ENCOUNTER — Ambulatory Visit (INDEPENDENT_AMBULATORY_CARE_PROVIDER_SITE_OTHER): Payer: Self-pay

## 2020-06-27 ENCOUNTER — Other Ambulatory Visit: Payer: Self-pay

## 2020-06-27 VITALS — Wt 168.0 lb

## 2020-06-27 DIAGNOSIS — Z23 Encounter for immunization: Secondary | ICD-10-CM

## 2020-06-27 NOTE — Progress Notes (Signed)
   Covid-19 Vaccination Clinic  Name:  Maria Gallagher    MRN: [id number] DOB: 08/08/1986  06/27/2020  [name] was observed post Covid-19 immunization for 15 minutes without incident. She was provided with Vaccine Information Sheet and instruction to access the V-Safe system.   [name] was instructed to call 911 with any severe reactions post vaccine: Marland Kitchen Difficulty breathing  . Swelling of face and throat  . A fast heartbeat  . A bad rash all over body  . Dizziness and weakness   Immunizations Administered    Name Date Dose VIS Date Route   Pfizer COVID-19 Vaccine 06/27/2020 11:32 AM 0.3 mL 02/21/2019 Intramuscular   Manufacturer: [employer], [employer]   Lot: [id number]   NDC: [address]-4

## 2020-07-19 ENCOUNTER — Ambulatory Visit: Payer: Self-pay

## 2020-07-22 ENCOUNTER — Ambulatory Visit: Payer: Self-pay

## 2020-07-26 ENCOUNTER — Ambulatory Visit (INDEPENDENT_AMBULATORY_CARE_PROVIDER_SITE_OTHER): Payer: Self-pay

## 2020-07-26 ENCOUNTER — Other Ambulatory Visit: Payer: Self-pay

## 2020-07-26 DIAGNOSIS — Z23 Encounter for immunization: Secondary | ICD-10-CM

## 2020-07-26 NOTE — Progress Notes (Signed)
   Covid-19 Vaccination Clinic  Name:  Asiah Browder    MRN: [id number] DOB: [birth year]  07/26/2020  Ms. Zuba was observed post Covid-19 immunization for 15 minutes without incident. She was provided with Vaccine Information Sheet and instruction to access the V-Safe system.   Ms. Mitcheltree was instructed to call 911 with any severe reactions post vaccine: Marland Kitchen Difficulty breathing  . Swelling of face and throat  . A fast heartbeat  . A bad rash all over body  . Dizziness and weakness   Immunizations Administered    Name Date Dose VIS Date Route   Pfizer COVID-19 Vaccine 07/26/2020  2:59 PM 0.3 mL 02/21/2019 Intramuscular   Manufacturer: [employer], [employer]   Lot: [id number]   NDC: [address]-3
